# Patient Record
Sex: Female | Born: 2000 | Race: Black or African American | Hispanic: No | Marital: Single | Smoking: Never smoker
Health system: Southern US, Community
[De-identification: ages and names within clinical notes are randomized; demographics above are authoritative.]

## PROBLEM LIST (undated history)

## (undated) DIAGNOSIS — B54 Unspecified malaria: Secondary | ICD-10-CM

## (undated) DIAGNOSIS — N390 Urinary tract infection, site not specified: Secondary | ICD-10-CM

## (undated) DIAGNOSIS — A0100 Typhoid fever, unspecified: Secondary | ICD-10-CM

## (undated) DIAGNOSIS — J45909 Unspecified asthma, uncomplicated: Secondary | ICD-10-CM

## (undated) HISTORY — DX: Unspecified malaria: B54

## (undated) HISTORY — DX: Unspecified asthma, uncomplicated: J45.909

## (undated) HISTORY — DX: Typhoid fever, unspecified: A01.00

## (undated) HISTORY — PX: THROAT SURGERY: SHX803

## (undated) HISTORY — DX: Urinary tract infection, site not specified: N39.0

---
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
## 2020-02-02 DIAGNOSIS — Z23 Encounter for immunization: Secondary | ICD-10-CM | POA: Diagnosis not present

## 2020-04-05 ENCOUNTER — Ambulatory Visit (INDEPENDENT_AMBULATORY_CARE_PROVIDER_SITE_OTHER): Payer: Medicaid Other | Admitting: Family Medicine

## 2020-04-05 ENCOUNTER — Other Ambulatory Visit: Payer: Self-pay

## 2020-04-05 VITALS — BP 100/65 | HR 67 | Ht 63.0 in | Wt 127.6 lb

## 2020-04-05 DIAGNOSIS — Z30013 Encounter for initial prescription of injectable contraceptive: Secondary | ICD-10-CM | POA: Diagnosis not present

## 2020-04-05 LAB — POCT URINE PREGNANCY: Preg Test, Ur: NEGATIVE

## 2020-04-05 MED ORDER — MEDROXYPROGESTERONE ACETATE 150 MG/ML IM SUSY
150.0000 mg | PREFILLED_SYRINGE | Freq: Once | INTRAMUSCULAR | Status: AC
  Administered 2020-04-05: 150 mg via INTRAMUSCULAR

## 2020-04-05 NOTE — Progress Notes (Signed)
Patient presents for contraceptive.  Pregnancy test negative.  Depo-Provera given in Right Upper Outer Quadrant. Patient tolerated well.  Reminder card given 06-21-2020--07-05-2020  .Glennie Hawk, CMA

## 2020-04-05 NOTE — Assessment & Plan Note (Addendum)
Long discussion regarding contraception including different types and potential side effects. Patient has opted for the Depo Shot every 3 months. Urine pregnancy test negative Depo shot given today. Follow up within 3 months for repeat, sooner if any side effects. Did discuss potential break through bleeding and that this could last for 6-12 months. Patient voiced understanding and agreement with plan.

## 2020-04-05 NOTE — Progress Notes (Signed)
   Subjective:   Patient ID: Amy Petersen    DOB: 2000/07/14, 19 y.o. female   MRN: [id number]  Amy Petersen is a 19 y.o. female with a history of seasonal allergic rhinitis, mild acne, recurrent UTI, refugee here for personal issues.  Contraception Management: Patient interested in starting birth control. She has not been on birth control before. LMP 03/10/20. Notes periods are pretty regular. She is interested in birth control primarily for pregnancy protection. Last sexual intercourse was 1 month ago (after her period), she did use a condom. Denies history of blood clots, migraines with aura, or tobacco use.  Review of Systems:  Per HPI.   Objective:   BP 100/65   Pulse 67   Ht 5\' 3"  (1.6 m)   Wt 127 lb 9.6 oz (57.9 kg)   SpO2 100%   BMI 22.60 kg/m  Vitals and nursing note reviewed.  General: pleasant young female, sitting comfortably in exam chair, well nourished, well developed, in no acute distress with non-toxic appearance Resp: breathing comfortably on room air, speaking in full sentences MSK:  gait normal Neuro: Alert and oriented, speech normal  Assessment & Plan:   Encounter for initial prescription of injectable contraceptive Long discussion regarding contraception including different types and potential side effects. Patient has opted for the Depo Shot every 3 months. Urine pregnancy test negative Depo shot given today. Follow up within 3 months for repeat, sooner if any side effects. Did discuss potential break through bleeding and that this could last for 6-12 months. Patient voiced understanding and agreement with plan.  Orders Placed This Encounter  Procedures  . POCT urine pregnancy   Meds ordered this encounter  Medications  . medroxyPROGESTERone Acetate SUSY 150 mg    , DO PGY-3, [facility] 04/05/2020 12:15 PM

## 2020-05-03 DIAGNOSIS — Z20828 Contact with and (suspected) exposure to other viral communicable diseases: Secondary | ICD-10-CM | POA: Diagnosis not present

## 2020-07-18 DIAGNOSIS — R0600 Dyspnea, unspecified: Secondary | ICD-10-CM | POA: Diagnosis not present

## 2020-08-29 ENCOUNTER — Ambulatory Visit (HOSPITAL_COMMUNITY)
Admission: RE | Admit: 2020-08-29 | Discharge: 2020-08-29 | Disposition: A | Discharge status: Home / Self Care | Payer: Medicaid Other | Source: Ambulatory Visit | Attending: Family Medicine | Admitting: Family Medicine

## 2020-08-29 ENCOUNTER — Other Ambulatory Visit: Payer: Self-pay

## 2020-08-29 ENCOUNTER — Ambulatory Visit (INDEPENDENT_AMBULATORY_CARE_PROVIDER_SITE_OTHER): Payer: Medicaid Other | Admitting: Family Medicine

## 2020-08-29 ENCOUNTER — Ambulatory Visit
Admission: RE | Admit: 2020-08-29 | Discharge: 2020-08-29 | Disposition: A | Discharge status: Home / Self Care | Payer: Medicaid Other | Source: Ambulatory Visit | Attending: Family Medicine | Admitting: Family Medicine

## 2020-08-29 VITALS — BP 118/72 | HR 84 | Wt 138.0 lb

## 2020-08-29 DIAGNOSIS — L503 Dermatographic urticaria: Secondary | ICD-10-CM

## 2020-08-29 DIAGNOSIS — N926 Irregular menstruation, unspecified: Secondary | ICD-10-CM | POA: Diagnosis not present

## 2020-08-29 DIAGNOSIS — Z0289 Encounter for other administrative examinations: Secondary | ICD-10-CM

## 2020-08-29 DIAGNOSIS — J301 Allergic rhinitis due to pollen: Secondary | ICD-10-CM

## 2020-08-29 DIAGNOSIS — R0602 Shortness of breath: Secondary | ICD-10-CM | POA: Diagnosis not present

## 2020-08-29 DIAGNOSIS — R059 Cough, unspecified: Secondary | ICD-10-CM | POA: Diagnosis not present

## 2020-08-29 LAB — POCT URINE PREGNANCY: Preg Test, Ur: NEGATIVE

## 2020-08-29 MED ORDER — LEVOCETIRIZINE DIHYDROCHLORIDE 5 MG PO TABS: 5.0000 mg | ORAL_TABLET | Freq: Every evening | ORAL | 2 refills | Status: DC

## 2020-08-29 MED ORDER — HYDROXYZINE PAMOATE 25 MG PO CAPS: 25.0000 mg | ORAL_CAPSULE | Freq: Three times a day (TID) | ORAL | 1 refills | Status: DC | PRN

## 2020-08-29 NOTE — Patient Instructions (Addendum)
It was great seeing you today!  Please check-out at the front desk before leaving the clinic. I'd like to see you back in 2 weeks but if you need to be seen earlier than that for any new issues we're happy to fit you in, just give [place] a call!  Visit Remembers: - Stop by the pharmacy to pick up your prescriptions  - Medicine Changes: Start Atarax (for itching and anxiety). Start Xyzal for allergies.  - To Do: ** Stop by [facility] to get your chest xray today or tomorrow  ** Keep a diary of your symptoms. Document: what day symptoms occurred, how long it lasted, what you did to make it go away, if you were outside that day   Regarding lab work today:  Due to recent changes in healthcare laws, you may see the results of your imaging and laboratory studies on MyChart before your provider has had a chance to review them.  I understand that in some cases there may be results that are confusing or concerning to you. Not all laboratory results come back in the same time frame and you may be waiting for multiple results in order to interpret others.  Please give [place] 72 hours in order for your provider to thoroughly review all the results before contacting the office for clarification of your results. If everything is normal, you will get a letter in the mail or a message in My Chart. Please give [place] a call if you do not hear from [place] after 2 weeks.  Please bring all of your medications with you to each visit.    If you haven't already, sign up for My Chart to have easy access to your labs results, and communication with your primary care physician.  Feel free to call with any questions or concerns at any time, at [phone].   Take care,  Dr. Katherina [facility]

## 2020-08-29 NOTE — Progress Notes (Signed)
   SUBJECTIVE:   CHIEF COMPLAINT / HPI:   Chief Complaint  Patient presents with  . Shortness of Breath     Amy Petersen is a 20 y.o. female here for shortness of breath.   Pt reports shortness of breath occurs while at rest. She plays soccer and reports no shortness of breath while being active. Sx started a couple months ago.  She feels like she " is running out of breath."  Last night her family looked at her while she was breathing funny and states that she was wheezing.  She occasionally has a dry cough and chest discomfort during these episodes.  States she has had similar symptoms in the past and thought to be related to allergies when she first moved to the [place].  She is currently not taking any allergy medication.  Earlier this week she went [facility] who found no cause of her symptoms.  Palpitations, daily cough, congestion, rhinorrhea, fevers, myalgias, recent trauma to chest, unintended weight loss or night sweats.  Episodes typically last about 20 minutes but 2 days ago and lasted for 1 hour.  States that her heart is not beating fast nor she trembling during these episodes.  It does go away when she stops thinking about it.  Because the episode lasted an hour she decided to make an appointment sure everything was okay.   PERTINENT  PMH / PSH: reviewed and updated as appropriate   OBJECTIVE:   BP 118/72   Pulse 84   Wt 138 lb (62.6 kg)   LMP 07/30/2020 Comment: just stopped taking depo  BMI 24.45 kg/m    GEN: pleasant well-appearing female, in no acute distress  NECK: Supple, no lymphadenopathy, normal range of motion CV: regular rate and rhythm, no murmurs appreciated, well perfused, no chest wall tenderness RESP: no increased work of breathing, clear to ascultation bilaterally with no crackles, wheezes, or rhonchi  ABD: Bowel sounds present.  Soft, nontender, nondistended  MSK: no extremity edema, or calf tenderness SKIN: warm, dry, upper back with  erythematous raised lines after scratching     ASSESSMENT/PLAN:   Dermatography Suspected dermatography on exam after patient lightly scratched her upper back. Trial Xyzal.   Seasonal allergic rhinitis due to pollen Refilled Flonase. Start Xyzal 5 mg daily.   Shortness of breath Pt is a 20 yo female with intermittent dyspnea at rest. Per chart review, there is questionable hx of asthma. Will give inhaler. Start Xyzal. Patient is a refugee will obtain a quant gold. EKG unremarkable, HR 68, normal axis, no acute ST or T wave changes. CXR ordered. Possibly anxiety related. Trial Atarax during events. Advised patient to keep event log. Follow up in 2 weeks.      Katha Cabal, DO PGY-2, [facility] 08/30/2020

## 2020-08-30 DIAGNOSIS — L503 Dermatographic urticaria: Secondary | ICD-10-CM | POA: Insufficient documentation

## 2020-08-30 DIAGNOSIS — R0602 Shortness of breath: Secondary | ICD-10-CM | POA: Insufficient documentation

## 2020-08-30 MED ORDER — ALBUTEROL SULFATE HFA 108 (90 BASE) MCG/ACT IN AERS: 2.0000 | INHALATION_SPRAY | Freq: Four times a day (QID) | RESPIRATORY_TRACT | 2 refills | Status: DC | PRN

## 2020-08-30 MED ORDER — FLUTICASONE PROPIONATE 50 MCG/ACT NA SUSP: 2.0000 | Freq: Every day | NASAL | 6 refills | Status: DC

## 2020-08-30 NOTE — Assessment & Plan Note (Signed)
Refilled Flonase. Start Xyzal 5 mg daily.

## 2020-08-30 NOTE — Assessment & Plan Note (Addendum)
Suspected dermatography on exam after patient lightly scratched her upper back. Trial Xyzal.

## 2020-08-30 NOTE — Assessment & Plan Note (Addendum)
Pt is a 20 yo female with intermittent dyspnea at rest. Per chart review, there is questionable hx of asthma. Will give inhaler. Start Xyzal. Patient is a refugee will obtain a quant gold. EKG unremarkable, HR 68, normal axis, no acute ST or T wave changes. CXR ordered. Possibly anxiety related. Trial Atarax during events. Advised patient to keep event log. Follow up in 2 weeks.

## 2020-09-02 LAB — QUANTIFERON-TB GOLD PLUS
QuantiFERON Mitogen Value: >10 IU/mL
QuantiFERON Nil Value: 0.03 IU/mL
QuantiFERON TB1 Ag Value: 0.04 IU/mL
QuantiFERON TB2 Ag Value: 0.03 IU/mL
QuantiFERON-TB Gold Plus: NEGATIVE

## 2020-09-02 LAB — TSH+FREE T4
Free T4: 1.17 ng/dL (ref 0.93–1.60)
TSH: 0.797 u[IU]/mL (ref 0.450–4.500)

## 2020-09-02 LAB — CBC
Hematocrit: 40 % (ref 34.0–46.6)
Hemoglobin: 13.5 g/dL (ref 11.1–15.9)
MCH: 28.2 pg (ref 26.6–33.0)
MCHC: 33.8 g/dL (ref 31.5–35.7)
MCV: 84 fL (ref 79–97)
Platelets: 371 10*3/uL (ref 150–450)
RBC: 4.78 x10E6/uL (ref 3.77–5.28)
RDW: 12.4 % (ref 11.7–15.4)
WBC: 6.7 10*3/uL (ref 3.4–10.8)

## 2020-09-10 ENCOUNTER — Ambulatory Visit (INDEPENDENT_AMBULATORY_CARE_PROVIDER_SITE_OTHER): Payer: Medicaid Other | Admitting: Family Medicine

## 2020-09-10 DIAGNOSIS — Z5329 Procedure and treatment not carried out because of patient's decision for other reasons: Secondary | ICD-10-CM

## 2020-09-11 DIAGNOSIS — Z5329 Procedure and treatment not carried out because of patient's decision for other reasons: Secondary | ICD-10-CM | POA: Insufficient documentation

## 2020-09-11 DIAGNOSIS — Z91199 Patient's noncompliance with other medical treatment and regimen due to unspecified reason: Secondary | ICD-10-CM | POA: Insufficient documentation

## 2020-09-11 NOTE — Progress Notes (Signed)
Patient was a no-show to her 09/11/2020 appointment.  She can reschedule as needed.  Peggyann Shoals, DO [facility], PGY-3 09/11/2020 9:31 AM

## 2021-01-25 ENCOUNTER — Ambulatory Visit: Payer: Medicaid Other | Admitting: Family Medicine

## 2021-02-22 ENCOUNTER — Other Ambulatory Visit: Payer: Self-pay

## 2021-02-22 ENCOUNTER — Ambulatory Visit (INDEPENDENT_AMBULATORY_CARE_PROVIDER_SITE_OTHER): Payer: Medicaid Other | Admitting: Family Medicine

## 2021-02-22 ENCOUNTER — Encounter: Payer: Self-pay | Admitting: Family Medicine

## 2021-02-22 VITALS — BP 117/76 | HR 68 | Wt 135.4 lb

## 2021-02-22 DIAGNOSIS — R0602 Shortness of breath: Secondary | ICD-10-CM

## 2021-02-22 DIAGNOSIS — Z309 Encounter for contraceptive management, unspecified: Secondary | ICD-10-CM | POA: Diagnosis not present

## 2021-02-22 DIAGNOSIS — Z3009 Encounter for other general counseling and advice on contraception: Secondary | ICD-10-CM | POA: Diagnosis not present

## 2021-02-22 LAB — POCT URINE PREGNANCY: Preg Test, Ur: NEGATIVE

## 2021-02-22 MED ORDER — NORETHIN ACE-ETH ESTRAD-FE 1.5-30 MG-MCG PO TABS: 1.0000 | ORAL_TABLET | Freq: Every day | ORAL | 11 refills | Status: DC

## 2021-02-22 MED ORDER — XULANE 150-35 MCG/24HR TD PTWK: 1.0000 | MEDICATED_PATCH | TRANSDERMAL | 12 refills | Status: DC

## 2021-02-22 MED ORDER — ALBUTEROL SULFATE HFA 108 (90 BASE) MCG/ACT IN AERS
2.0000 | INHALATION_SPRAY | Freq: Four times a day (QID) | RESPIRATORY_TRACT | 2 refills | Status: DC | PRN
Start: 2021-02-22 — End: 2023-07-10

## 2021-02-22 NOTE — Progress Notes (Signed)
    SUBJECTIVE:   CHIEF COMPLAINT / HPI:   Birth Control Counseling  Amy Petersen is a 20 y.o. female who presents to the clinic today to discuss birth control options. She was previously seen in the office for this in 03/2020 and decided on Depo at that time. She states that she did not like how this affected her periods and does not wish to continue on it. She did not get any additional Depo injections after her first. Her LMP was 10/5. She has one sexual partner. She denies history of migraine with aura, blood clotting disorders, tobacco use.   PERTINENT  PMH / PSH:  Past Medical History:  Diagnosis Date   Asthma    Malaria    Recurrent UTI    Typhoid      OBJECTIVE:   BP 117/76   Pulse 68   Wt 135 lb 6.4 oz (61.4 kg)   SpO2 100%   BMI 23.99 kg/m    General: NAD, pleasant, able to participate in exam Respiratory: Breathing comfortably on room air, no respiratory distress Neuro: alert, no obvious focal deficits Psych: Normal affect and mood  ASSESSMENT/PLAN:   Birth control counseling Pregnancy test negative today. Discussed the various options of birth control including LARC's such as IUD/Nexplanon, birth control pills, birth control patches, vaginal rings, Depo shot, condoms.  Patient is very hesitant about both Nexplanon and IUD at this time- given she has heard some bad stories and is worried about pain associated with the procedure. We discussed that these are the most effective forms and that most people do really well with this. She would like more time to think about this. In the meantime, she was interested in OCP. She has no contraindications. She was advised to use back up contraception given the quick start method.  Discussed how this would not protect her against sexually transmitted infections and to use barrier protection to prevent this. She was given a handout to read more about the different options. She should contact our office should she decide to pursue  an IUD or Nexplanon.  Shortness of breath Patient notes she has a questionable history of asthma.  She uses inhaler as needed and notes that it helps when she has these episodes of difficulty breathing.  She was seen for this in the past and advised to trial both hydroxyzine and Xyzal as there was suspicion this could be related to anxiety or allergies.  She denies taking those medications.  States she needs a refill on her albuterol.  Will refill albuterol to be used PRN for SOB/wheezing.     Sabino Dick, DO [facility]

## 2021-02-22 NOTE — Assessment & Plan Note (Signed)
Patient notes she has a questionable history of asthma.  She uses inhaler as needed and notes that it helps when she has these episodes of difficulty breathing.  She was seen for this in the past and advised to trial both hydroxyzine and Xyzal as there was suspicion this could be related to anxiety or allergies.  She denies taking those medications.  States she needs a refill on her albuterol.  Will refill albuterol to be used PRN for SOB/wheezing.

## 2021-02-22 NOTE — Patient Instructions (Addendum)
It was wonderful to see you today.  Today we talked about:  -I have sent in birth control pills. It is important to take this at the same time every day.  -Know that this does not prevent against sexually transmitted infections.  -Use back up protection for 7-days. -If you decide that you want a Nexplanon (arm implant) or the IUD (intra-uterine implant), please schedule an appointment for placement.    Thank you for choosing [facility].   Please call [phone] with any questions about today's appointment.  Please be sure to schedule follow up at the front  desk before you leave today.   Sabino Dick, DO PGY-2 Family Medicine   Contraception Choices Contraception, also called birth control, refers to methods or devices that prevent pregnancy. Hormonal methods Contraceptive implant A contraceptive implant is a thin, plastic tube that contains a hormone that prevents pregnancy. It is different from an intrauterine device (IUD). It is inserted into the upper part of the arm by a health care provider. Implants can be effective for up to 3 years. Progestin-only injections Progestin-only injections are injections of progestin, a synthetic form of the hormone progesterone. They are given every 3 months by a health care provider. Birth control pills Birth control pills are pills that contain hormones that prevent pregnancy. They must be taken once a day, preferably at the same time each day. A prescription is needed to use this method of contraception. Birth control patch The birth control patch contains hormones that prevent pregnancy. It is placed on the skin and must be changed once a week for three weeks and removed on the fourth week. A prescription is needed to use this method of contraception. Vaginal ring A vaginal ring contains hormones that prevent pregnancy. It is placed in the vagina for three weeks and removed on the fourth week. After that, the process is  repeated with a new ring. A prescription is needed to use this method of contraception. Emergency contraceptive Emergency contraceptives prevent pregnancy after unprotected sex. They come in pill form and can be taken up to 5 days after sex. They work best the sooner they are taken after having sex. Most emergency contraceptives are available without a prescription. This method should not be used as your only form of birth control. Barrier methods Female condom A female condom is a thin sheath that is worn over the penis during sex. Condoms keep sperm from going inside a woman's body. They can be used with a sperm-killing substance (spermicide) to increase their effectiveness. They should be thrown away after one use. Female condom A female condom is a soft, loose-fitting sheath that is put into the vagina before sex. The condom keeps sperm from going inside a woman's body. They should be thrown away after one use. Diaphragm A diaphragm is a soft, dome-shaped barrier. It is inserted into the vagina before sex, along with a spermicide. The diaphragm blocks sperm from entering the uterus, and the spermicide kills sperm. A diaphragm should be left in the vagina for 6-8 hours after sex and removed within 24 hours. A diaphragm is prescribed and fitted by a health care provider. A diaphragm should be replaced every 1-2 years, after giving birth, after gaining more than 15 lb (6.8 kg), and after pelvic surgery. Cervical cap A cervical cap is a round, soft latex or plastic cup that fits over the cervix. It is inserted into the vagina before sex, along with spermicide. It blocks sperm from entering  the uterus. The cap should be left in place for 6-8 hours after sex and removed within 48 hours. A cervical cap must be prescribed and fitted by a health care provider. It should be replaced every 2 years. Sponge A sponge is a soft, circular piece of polyurethane foam with spermicide in it. The sponge helps block sperm  from entering the uterus, and the spermicide kills sperm. To use it, you make it wet and then insert it into the vagina. It should be inserted before sex, left in for at least 6 hours after sex, and removed and thrown away within 30 hours. Spermicides Spermicides are chemicals that kill or block sperm from entering the cervix and uterus. They can come as a cream, jelly, suppository, foam, or tablet. A spermicide should be inserted into the vagina with an applicator at least 10-15 minutes before sex to allow time for it to work. The process must be repeated every time you have sex. Spermicides do not require a prescription. Intrauterine contraception Intrauterine device (IUD) An IUD is a T-shaped device that is put in a woman's uterus. There are two types: Hormone IUD.This type contains progestin, a synthetic form of the hormone progesterone. This type can stay in place for 3-5 years. Copper IUD.This type is wrapped in copper wire. It can stay in place for 10 years. Permanent methods of contraception Female tubal ligation In this method, a woman's fallopian tubes are sealed, tied, or blocked during surgery to prevent eggs from traveling to the uterus. Hysteroscopic sterilization In this method, a small, flexible insert is placed into each fallopian tube. The inserts cause scar tissue to form in the fallopian tubes and block them, so sperm cannot reach an egg. The procedure takes about 3 months to be effective. Another form of birth control must be used during those 3 months. Female sterilization This is a procedure to tie off the tubes that carry sperm (vasectomy). After the procedure, the man can still ejaculate fluid (semen). Another form of birth control must be used for 3 months after the procedure. Natural planning methods Natural family planning In this method, a couple does not have sex on days when the woman could become pregnant. Calendar method In this method, the woman keeps track of the  length of each menstrual cycle, identifies the days when pregnancy can happen, and does not have sex on those days. Ovulation method In this method, a couple avoids sex during ovulation. Symptothermal method This method involves not having sex during ovulation. The woman typically checks for ovulation by watching changes in her temperature and in the consistency of cervical mucus. Post-ovulation method In this method, a couple waits to have sex until after ovulation. Where to find more information Centers for Disease Control and Prevention: [employer] Summary Contraception, also called birth control, refers to methods or devices that prevent pregnancy. Hormonal methods of contraception include implants, injections, pills, patches, vaginal rings, and emergency contraceptives. Barrier methods of contraception can include female condoms, female condoms, diaphragms, cervical caps, sponges, and spermicides. There are two types of IUDs (intrauterine devices). An IUD can be put in a woman's uterus to prevent pregnancy for 3-5 years. Permanent sterilization can be done through a procedure for males and females. Natural family planning methods involve nothaving sex on days when the woman could become pregnant. This information is not intended to replace advice given to you by your health care provider. Make sure you discuss any questions you have with your health care provider. Document  Revised: 09/12/2019 Document Reviewed: 09/12/2019 Elsevier Patient Education  2022 [employer].

## 2021-02-22 NOTE — Assessment & Plan Note (Signed)
Pregnancy test negative today. Discussed the various options of birth control including LARC's such as IUD/Nexplanon, birth control pills, birth control patches, vaginal rings, Depo shot, condoms.  Patient is very hesitant about both Nexplanon and IUD at this time- given she has heard some bad stories and is worried about pain associated with the procedure. We discussed that these are the most effective forms and that most people do really well with this. She would like more time to think about this. In the meantime, she was interested in OCP. She has no contraindications. She was advised to use back up contraception given the quick start method.  Discussed how this would not protect her against sexually transmitted infections and to use barrier protection to prevent this. She was given a handout to read more about the different options. She should contact our office should she decide to pursue an IUD or Nexplanon.

## 2021-04-17 DIAGNOSIS — S134XXA Sprain of ligaments of cervical spine, initial encounter: Secondary | ICD-10-CM | POA: Diagnosis not present

## 2021-04-17 DIAGNOSIS — S20219A Contusion of unspecified front wall of thorax, initial encounter: Secondary | ICD-10-CM | POA: Diagnosis not present

## 2021-05-15 ENCOUNTER — Encounter: Payer: Self-pay | Admitting: Family Medicine

## 2021-05-15 ENCOUNTER — Ambulatory Visit (INDEPENDENT_AMBULATORY_CARE_PROVIDER_SITE_OTHER): Payer: Medicaid Other | Admitting: Family Medicine

## 2021-05-15 ENCOUNTER — Other Ambulatory Visit: Payer: Self-pay

## 2021-05-15 ENCOUNTER — Ambulatory Visit
Admission: RE | Admit: 2021-05-15 | Discharge: 2021-05-15 | Disposition: A | Discharge status: Home / Self Care | Payer: Medicaid Other | Source: Ambulatory Visit | Attending: Family Medicine | Admitting: Family Medicine

## 2021-05-15 VITALS — BP 112/71 | HR 104 | Wt 139.2 lb

## 2021-05-15 DIAGNOSIS — R0602 Shortness of breath: Secondary | ICD-10-CM

## 2021-05-15 DIAGNOSIS — Z3009 Encounter for other general counseling and advice on contraception: Secondary | ICD-10-CM | POA: Diagnosis not present

## 2021-05-15 DIAGNOSIS — R0600 Dyspnea, unspecified: Secondary | ICD-10-CM | POA: Diagnosis not present

## 2021-05-15 LAB — D-DIMER, QUANTITATIVE: D-DIMER: 0.36 mg/L FEU (ref 0.00–0.49)

## 2021-05-15 NOTE — Assessment & Plan Note (Signed)
Given tachycardia on presentation, consideration of PE, thought I think this is lower probability given duration, symptoms. CBC, D-dimer sent stat. CXR obtained. Scheduled for PFT. Pending results, will start inhaler. Heart rate had normalized on exam and no hypoxemia today.

## 2021-05-15 NOTE — Assessment & Plan Note (Signed)
Discussed she will consider Nexplanon. Other options discussed today as well-declined Depo. Discussed access to Plan B, Condoms, recommended prenatal.

## 2021-05-15 NOTE — Progress Notes (Signed)
° ° °  SUBJECTIVE:   CHIEF COMPLAINT: dyspnea  HPI:   Amy Petersen is a 21 y.o.  with history notable for recurrent UTI and allergies presenting for follow up for dyspnea.  The patient has been twice for dyspnea over past year. Reports flares of this over past 2 weeks. Reports episodes throughout day, regardless of activity of dyspnea, higher heart rate, and chest tightness. No chest pain, no LE edema, no diaphoresis, no nausea, or vomiting. Had improvement with inhaler. Was on COC but stopped these due to headaches. She is interested in Nexplanon (possibly).   She is sexually active, 1 partner, uses condoms. She is sophomore at [employer].    PERTINENT  PMH / PSH/Family/Social History : No family history of VTE or asthma, she has a history of allergies   Prior EKG, CXR one year ago reviewed  OBJECTIVE:   BP 112/71    Pulse (!) 104    Wt 139 lb 3.2 oz (63.1 kg)    LMP 04/04/2021 (Approximate)    SpO2 100%    BMI 24.66 kg/m   Today's weight:  Last Weight  Most recent update: 05/15/2021  8:52 AM    Weight  63.1 kg (139 lb 3.2 oz)            Review of prior weights: Filed Weights   05/15/21 0851  Weight: 139 lb 3.2 oz (63.1 kg)     Cardiac: Regular rate and rhythm. Normal S1/S2. No murmurs, rubs, or gallops appreciated. Lungs: Clear bilaterally to ascultation.  Abdomen: Normoactive bowel sounds. No tenderness to deep or light palpation. No rebound or guarding.   Psych: Pleasant and appropriate  No LE edema    ASSESSMENT/PLAN:   Shortness of breath Given tachycardia on presentation, consideration of PE, thought I think this is lower probability given duration, symptoms. CBC, D-dimer sent stat. CXR obtained. Scheduled for PFT. Pending results, will start inhaler. Heart rate had normalized on exam and no hypoxemia today.   Birth control counseling Discussed she will consider Nexplanon. Other options discussed today as well-declined Depo. Discussed access to Plan B,  Condoms, recommended prenatal.    HCM  HPV today     Terisa Starr, MD  Family Medicine Teaching Service  [facility]

## 2021-05-15 NOTE — Patient Instructions (Addendum)
It was wonderful to see you today.  Please bring ALL of your medications with you to every visit.   Today we talked about: An x-ray was ordered for you---you do not need an appointment to have this completed.  I recommend going to [facility] [address] [place] [address] [place]   If the results are normal,I will send you a letter  I will call you with results if anything is abnormal   I will call you with blood work   Thank you for choosing [facility].   Please call [phone] with any questions about today's appointment.  Please be sure to schedule follow up at the front  desk before you leave today.   Terisa Starr, MD  Family Medicine

## 2021-05-16 LAB — CBC
Hematocrit: 39.3 % (ref 34.0–46.6)
Hemoglobin: 13.3 g/dL (ref 11.1–15.9)
MCH: 27.8 pg (ref 26.6–33.0)
MCHC: 33.8 g/dL (ref 31.5–35.7)
MCV: 82 fL (ref 79–97)
Platelets: 349 10*3/uL (ref 150–450)
RBC: 4.78 x10E6/uL (ref 3.77–5.28)
RDW: 12.5 % (ref 11.7–15.4)
WBC: 9.8 10*3/uL (ref 3.4–10.8)

## 2021-05-23 ENCOUNTER — Other Ambulatory Visit: Payer: Self-pay

## 2021-05-23 ENCOUNTER — Encounter: Payer: Self-pay | Admitting: Pharmacist

## 2021-05-23 ENCOUNTER — Ambulatory Visit (INDEPENDENT_AMBULATORY_CARE_PROVIDER_SITE_OTHER): Payer: Medicaid Other | Admitting: Pharmacist

## 2021-05-23 ENCOUNTER — Encounter: Payer: Self-pay | Admitting: Family Medicine

## 2021-05-23 ENCOUNTER — Ambulatory Visit (INDEPENDENT_AMBULATORY_CARE_PROVIDER_SITE_OTHER): Payer: Medicaid Other | Admitting: Family Medicine

## 2021-05-23 VITALS — BP 109/63 | HR 77 | Ht 63.0 in | Wt 142.0 lb

## 2021-05-23 DIAGNOSIS — R0602 Shortness of breath: Secondary | ICD-10-CM

## 2021-05-23 DIAGNOSIS — Z30017 Encounter for initial prescription of implantable subdermal contraceptive: Secondary | ICD-10-CM | POA: Diagnosis not present

## 2021-05-23 DIAGNOSIS — Z23 Encounter for immunization: Secondary | ICD-10-CM | POA: Diagnosis not present

## 2021-05-23 DIAGNOSIS — Z3046 Encounter for surveillance of implantable subdermal contraceptive: Secondary | ICD-10-CM | POA: Diagnosis not present

## 2021-05-23 LAB — POCT URINE PREGNANCY: Preg Test, Ur: NEGATIVE

## 2021-05-23 NOTE — Assessment & Plan Note (Signed)
Patient has been experiencing shortness of breath and chest tightness for two years and taking Ventolin. Medication adherence reported as good.  Spirometry evaluation with pre- and post-bronchodilator reveals normal lung function.   -continue Ventolin inhaler q6h PRN   -Reviewed results of pulmonary function tests.  -Patient provided with and educated on use of peak flow meter.  Patient verbalized understanding of results and education.  Written pt instructions provided.

## 2021-05-23 NOTE — Progress Notes (Signed)
Noted and agree. 

## 2021-05-23 NOTE — Progress Notes (Signed)
° °  S:    Patient arrives in good spirits and ambulating independently. Presents for lung function evaluation.   Patient was referred and last seen by Primary Care Provider, Dr. Owens Shark, on 05/15/2021.   Patient reports breathing has been suboptimal. Reports using ventolin inhaler at least once a day if symptoms arise. The cold weather is often a trigger. When the coughing fits come on, it often arises suddenly. Patient describes it as chest tightness and shortness of breath.  Patient reports adherence to medications Patient reports last dose of albuterol was yesterday, Current asthma medications: Ventolin HFA 2 puffs q6h PRN Rescue inhaler use frequency: at least once a day; not caused by physical exertion. Reports inhaler helps a lot with symptom management.  Level of asthma sx control- in the last 4 weeks: Question Scoring Patient Score  Daytime sx more than twice a week Yes (1) No (0) 1  Any nighttime waking due to asthma Yes (1) No (0) 1  Reliever needed >2x/week Yes (1) No (0) 1  Any activity limitation due to asthma Yes (1) No (0) 0   Total Score 3  Well controlled - 0, partly controlled - 1-2, uncontrolled 3-4  O: Physical Exam Constitutional:      Appearance: Normal appearance. She is normal weight.  Pulmonary:     Effort: Pulmonary effort is normal.  Neurological:     Mental Status: She is alert.  Psychiatric:        Mood and Affect: Mood normal.        Behavior: Behavior normal.        Thought Content: Thought content normal.        Judgment: Judgment normal.    Review of Systems  Respiratory:  Positive for shortness of breath and wheezing (frequent with dyspnea). Negative for cough (none recent) and sputum production.   All other systems reviewed and are negative.  Vitals:   05/23/21 1507  BP: 118/78  Pulse: 84    See "scanned report" or Documentation Flowsheet (discrete results - PFTs) for  Spirometry results. Patient provided good effort while attempting  spirometry.   Lung Age = 65 Albuterol Neb  Lot# [id number]     Exp. 07/19/2022  A/P: Patient has been experiencing shortness of breath and chest tightness for two years and taking Ventolin. Medication adherence reported as good.  Spirometry evaluation with pre- and post-bronchodilator reveals normal lung function.   -continue Ventolin inhaler q6h PRN    -Reviewed results of pulmonary function tests.  -Patient provided with and educated on use of peak flow meter.  Patient verbalized understanding of results and education.  Written pt instructions provided.    F/U visit with Dr. Owens Shark in next 3-4wks.   Total time in face to face counseling 45 minutes.  Patient seen with Gala Murdoch, PharmD Candidate, and Rebbeca Paul, PharmD - PGY2 Pharmacy Resident.  Marland Kitchen

## 2021-05-23 NOTE — Patient Instructions (Signed)
Nexplanon Instructions After Insertion  Keep bandage clean and dry for 24 hours  May use ice/Tylenol/Ibuprofen for soreness or pain  If you develop fever, drainage or increased warmth from incision site-contact office immediately   

## 2021-05-23 NOTE — Patient Instructions (Addendum)
It was nice to see you today.  The PFT does not show reversible component. We will continue Ventolin HFA 2 puffs every 6h as needed.  Please follow up with Dr. Manson Passey in the next 3-4 weeks.

## 2021-05-23 NOTE — Progress Notes (Signed)
° ° °  SUBJECTIVE:   CHIEF COMPLAINT / HPI:   [name] is a 21 yo F who presents for the below.   Desires nexplanon placement Had a previous visit for birth control counseling and decided Was best for her.  States that the pills gave her bad migraines. Partner present today. No other concerns at this time.   PERTINENT  PMH / PSH: As above.  OBJECTIVE:   BP 109/63    Pulse 77    Ht 5\' 3"  (1.6 m)    Wt 142 lb (64.4 kg)    LMP 05/01/2021    SpO2 100%    BMI 25.15 kg/m   Results for orders placed or performed in visit on 05/23/21 (from the past 24 hour(s))  POCT urine pregnancy     Status: None   Collection Time: 05/23/21  9:50 AM  Result Value Ref Range   Preg Test, Ur Negative Negative   Physical Exam Vitals reviewed.  Pulmonary:     Breath sounds: Normal breath sounds.  Neurological:     Mental Status: She is alert and oriented to person, place, and time.  Psychiatric:        Mood and Affect: Mood normal.        Behavior: Behavior normal.   ASSESSMENT/PLAN:   Encounter for initial prescription of Nexplanon PROCEDURE NOTE: Nexplanon insertion Patient given informed consent, signed copy in the chart.  Appropriate time out taken  Pregnancy test was negative.  The patient's  left arm was prepped and draped in the usual sterile fashion.. The ruler used to measure and mark the insertion area 8 cm from medial epicondyle of the elbow. Local anaesthesia obtained using 1.5 cc of 1% lidocaine with epinephrine. Nexplanon was inserted per manufacturer's directions.  Nexplanon did not fully deploy and was visible at site of injection.  This Nexplanon was removed.  A new Nexplanon was used and step above repeated without issue with normal deployment. Less than 1 cc blood loss. The insertion site covered with steri-strips and a pressure bandage to minimize bruising. The patient tolerated the procedure well.  Post-procedural care instructions given. Patient is informed the removal date will be  in three years and package insert card filled out and given to her.  07/21/21, DO [facility]

## 2021-06-18 MED ORDER — ETONOGESTREL 68 MG ~~LOC~~ IMPL: 68.0000 mg | DRUG_IMPLANT | Freq: Once | SUBCUTANEOUS | Status: DC

## 2021-06-18 MED ORDER — ETONOGESTREL 68 MG ~~LOC~~ IMPL
68.0000 mg | DRUG_IMPLANT | Freq: Once | SUBCUTANEOUS | Status: AC
  Administered 2021-05-23: 68 mg via SUBCUTANEOUS

## 2021-06-18 NOTE — Addendum Note (Signed)
Addended by: Veronda Prude on: 06/18/2021 10:26 AM   Modules accepted: Orders

## 2021-09-11 NOTE — Progress Notes (Unsigned)
Synopsis: Referred for dyspnea by Westley Chandler, MD  Subjective:   PATIENT ID: Amy Petersen GENDER: female DOB: 2000-09-26, MRN: [id number]  No chief complaint on file.  20yF with history of asthma, malaria, typhoid, recurrent UTI self-referred for dyspnea  Otherwise pertinent review of systems is negative.  Past Medical History:  Diagnosis Date   Asthma    Malaria    Recurrent UTI    Typhoid      No family history on file.   Past Surgical History:  Procedure Laterality Date   THROAT SURGERY      Social History   Socioeconomic History   Marital status: Single    Spouse name: Not on file   Number of children: Not on file   Years of education: Not on file   Highest education level: Not on file  Occupational History   Not on file  Tobacco Use   Smoking status: Never   Smokeless tobacco: Never  Substance and Sexual Activity   Alcohol use: Never   Drug use: Never   Sexual activity: Never  Other Topics Concern   Not on file  Social History Narrative   Refugee Information   Number of Immediate Family Members: 2   Number of Immediate Family Members in [place]: 2(Katherine Mukaraubayiza)   Date of Arrival: 01/04/19   Country of Birth: [facility])   Country of Origin: [place]   Location of Refugee Camp: [place]   Reason for Leaving Home Country: Political opinion   Primary Language: Kinyarwanda/[place], [place], English   Able to Read in Primary Language: Yes   Able to Write in Primary Language: Yes   Education: High School(10th grade in [place])         Moved from [place] in September 2020    Celine Ahr is legal guardian    Social Determinants of Corporate investment banker Strain: Not on file  Food Insecurity: Not on file  Transportation Needs: Not on file  Physical Activity: Not on file  Stress: Not on file  Social Connections: Not on file  Intimate Partner Violence: Not on file     No Known Allergies   Outpatient Medications Prior to Visit  Medication  Sig Dispense Refill   albuterol (VENTOLIN HFA) 108 (90 Base) MCG/ACT inhaler Inhale 2 puffs into the lungs every 6 (six) hours as needed for wheezing or shortness of breath. 8 g 2   Facility-Administered Medications Prior to Visit  Medication Dose Route Frequency Provider Last Rate Last Admin   etonogestrel (NEXPLANON) implant 68 mg  68 mg Subdermal Once Autry-Lott, Simone, DO           Objective:   Physical Exam:  General appearance: 21 y.o., female, NAD, conversant  Eyes: anicteric sclerae; PERRL, tracking appropriately HENT: NCAT; MMM Neck: Trachea midline; no lymphadenopathy, no JVD Lungs: CTAB, no crackles, no wheeze, with normal respiratory effort CV: RRR, no murmur  Abdomen: Soft, non-tender; non-distended, BS present  Extremities: No peripheral edema, warm Skin: Normal turgor and texture; no rash Psych: Appropriate affect Neuro: Alert and oriented to person and place, no focal deficit     There were no vitals filed for this visit.   on *** LPM *** RA BMI Readings from Last 3 Encounters:  05/23/21 25.33 kg/m  05/23/21 25.15 kg/m  05/15/21 24.66 kg/m   Wt Readings from Last 3 Encounters:  05/23/21 143 lb (64.9 kg)  05/23/21 142 lb (64.4 kg)  05/15/21 139 lb 3.2 oz (63.1 kg)  CBC    Component Value Date/Time   WBC 9.8 05/15/2021 0930   RBC 4.78 05/15/2021 0930   HGB 13.3 05/15/2021 0930   HCT 39.3 05/15/2021 0930   PLT 349 05/15/2021 0930   MCV 82 05/15/2021 0930   MCH 27.8 05/15/2021 0930   MCHC 33.8 05/15/2021 0930   RDW 12.5 05/15/2021 0930   LYMPHSABS 2.7 02/22/2019 1641   EOSABS 0.2 02/22/2019 1641   BASOSABS 0.0 02/22/2019 1641    D-dimer 05/15/21 normal   Chest Imaging: CXR 05/15/21 unremarkable  Pulmonary Functions Testing Results:     View : No data to display.         Cleda Daub 05/23/21 with moderate obstruction, reduced FVC, no significant BD response        Assessment & Plan:    Plan:      Omar Person,  MD [facility] 09/11/2021 8:45 AM

## 2021-09-12 ENCOUNTER — Other Ambulatory Visit: Payer: Self-pay | Admitting: Student

## 2021-09-12 ENCOUNTER — Ambulatory Visit (INDEPENDENT_AMBULATORY_CARE_PROVIDER_SITE_OTHER): Payer: Medicaid Other | Admitting: Student

## 2021-09-12 ENCOUNTER — Encounter: Payer: Self-pay | Admitting: Student

## 2021-09-12 VITALS — BP 104/70 | HR 68 | Temp 98.8°F | Ht 63.0 in | Wt 139.0 lb

## 2021-09-12 DIAGNOSIS — J45998 Other asthma: Secondary | ICD-10-CM

## 2021-09-12 MED ORDER — SPACER/AERO-HOLDING CHAMBERS DEVI: 0 refills | Status: DC

## 2021-09-12 MED ORDER — BUDESONIDE-FORMOTEROL FUMARATE 160-4.5 MCG/ACT IN AERO: 2.0000 | INHALATION_SPRAY | Freq: Two times a day (BID) | RESPIRATORY_TRACT | 12 refills | Status: DC

## 2021-09-12 MED ORDER — AEROCHAMBER MV MISC: 0 refills | Status: DC

## 2021-09-12 NOTE — Patient Instructions (Addendum)
-   START taking SYMBICORT 2 puffs twice daily EVERYDAY with spacer. Rinse mouth and spacer after each use to prevent thrush. - STOP taking FLOVENT - CAN CONTINUE to use albuterol 1-2 puffs every 6 hours AS NEEDED ONLY. Ideally we get you to a place where you just need this maybe once a week or less - See you in late July or sooner as needed!

## 2021-09-13 ENCOUNTER — Ambulatory Visit: Payer: Medicaid Other | Admitting: Family Medicine

## 2021-09-13 LAB — CBC WITH DIFFERENTIAL/PLATELET
Basophils Absolute: 0.1 10*3/uL (ref 0.0–0.1)
Basophils Relative: 0.8 % (ref 0.0–3.0)
Eosinophils Absolute: 0.2 10*3/uL (ref 0.0–0.7)
Eosinophils Relative: 3.7 % (ref 0.0–5.0)
HCT: 37.8 % (ref 36.0–46.0)
Hemoglobin: 12.7 g/dL (ref 12.0–15.0)
Lymphocytes Relative: 46.2 % — ABNORMAL HIGH (ref 12.0–46.0)
Lymphs Abs: 3 10*3/uL (ref 0.7–4.0)
MCHC: 33.5 g/dL (ref 30.0–36.0)
MCV: 83.1 fl (ref 78.0–100.0)
Monocytes Absolute: 0.5 10*3/uL (ref 0.1–1.0)
Monocytes Relative: 7.2 % (ref 3.0–12.0)
Neutro Abs: 2.7 10*3/uL (ref 1.4–7.7)
Neutrophils Relative %: 42.1 % — ABNORMAL LOW (ref 43.0–77.0)
Platelets: 332 10*3/uL (ref 150.0–400.0)
RBC: 4.54 Mil/uL (ref 3.87–5.11)
RDW: 12.8 % (ref 11.5–14.6)
WBC: 6.4 10*3/uL (ref 4.5–10.5)

## 2021-09-13 LAB — IGE: IgE (Immunoglobulin E), Serum: 485 kU/L — ABNORMAL HIGH (ref <=114)

## 2021-09-13 MED ORDER — AEROCHAMBER MV MISC
0 refills | Status: DC
Start: 2021-09-13 — End: 2023-07-10

## 2021-09-13 NOTE — Progress Notes (Deleted)
    SUBJECTIVE:   CHIEF COMPLAINT: follow up  HPI:   Amy Petersen is a 21 y.o.  with history notable for persistent asthma presenting for ***.  She reports ***.    PERTINENT  PMH / PSH/Family/Social History :persistent asthma with mild obstruction on PFT per Pulmonary   OBJECTIVE:   There were no vitals taken for this visit.  Today's weight:  Review of prior weights: There were no vitals filed for this visit.   Cardiac: Regular rate and rhythm. Normal S1/S2. No murmurs, rubs, or gallops appreciated. Lungs: Clear bilaterally to ascultation.  Abdomen: Normoactive bowel sounds. No tenderness to deep or light palpation. No rebound or guarding.    Psych: Pleasant and appropriate    ASSESSMENT/PLAN:   No problem-specific Assessment & Plan notes found for this encounter.     {    This will disappear when note is signed, click to select method of visit    :1}  Dorris Singh, [name]

## 2021-09-16 IMAGING — CR DG CHEST 2V
2 series · 2 of 2 positions shown · non-contrast
Comparison: No pertinent prior exams available for comparison.

CLINICAL DATA: Shortness of breath. Additional history provided by
technologist: Patient reports shortness of breath, midline chest
pressure, cough "off and on" for approximately 2 months.

EXAM:
CHEST - 2 VIEW

[w chest pa]
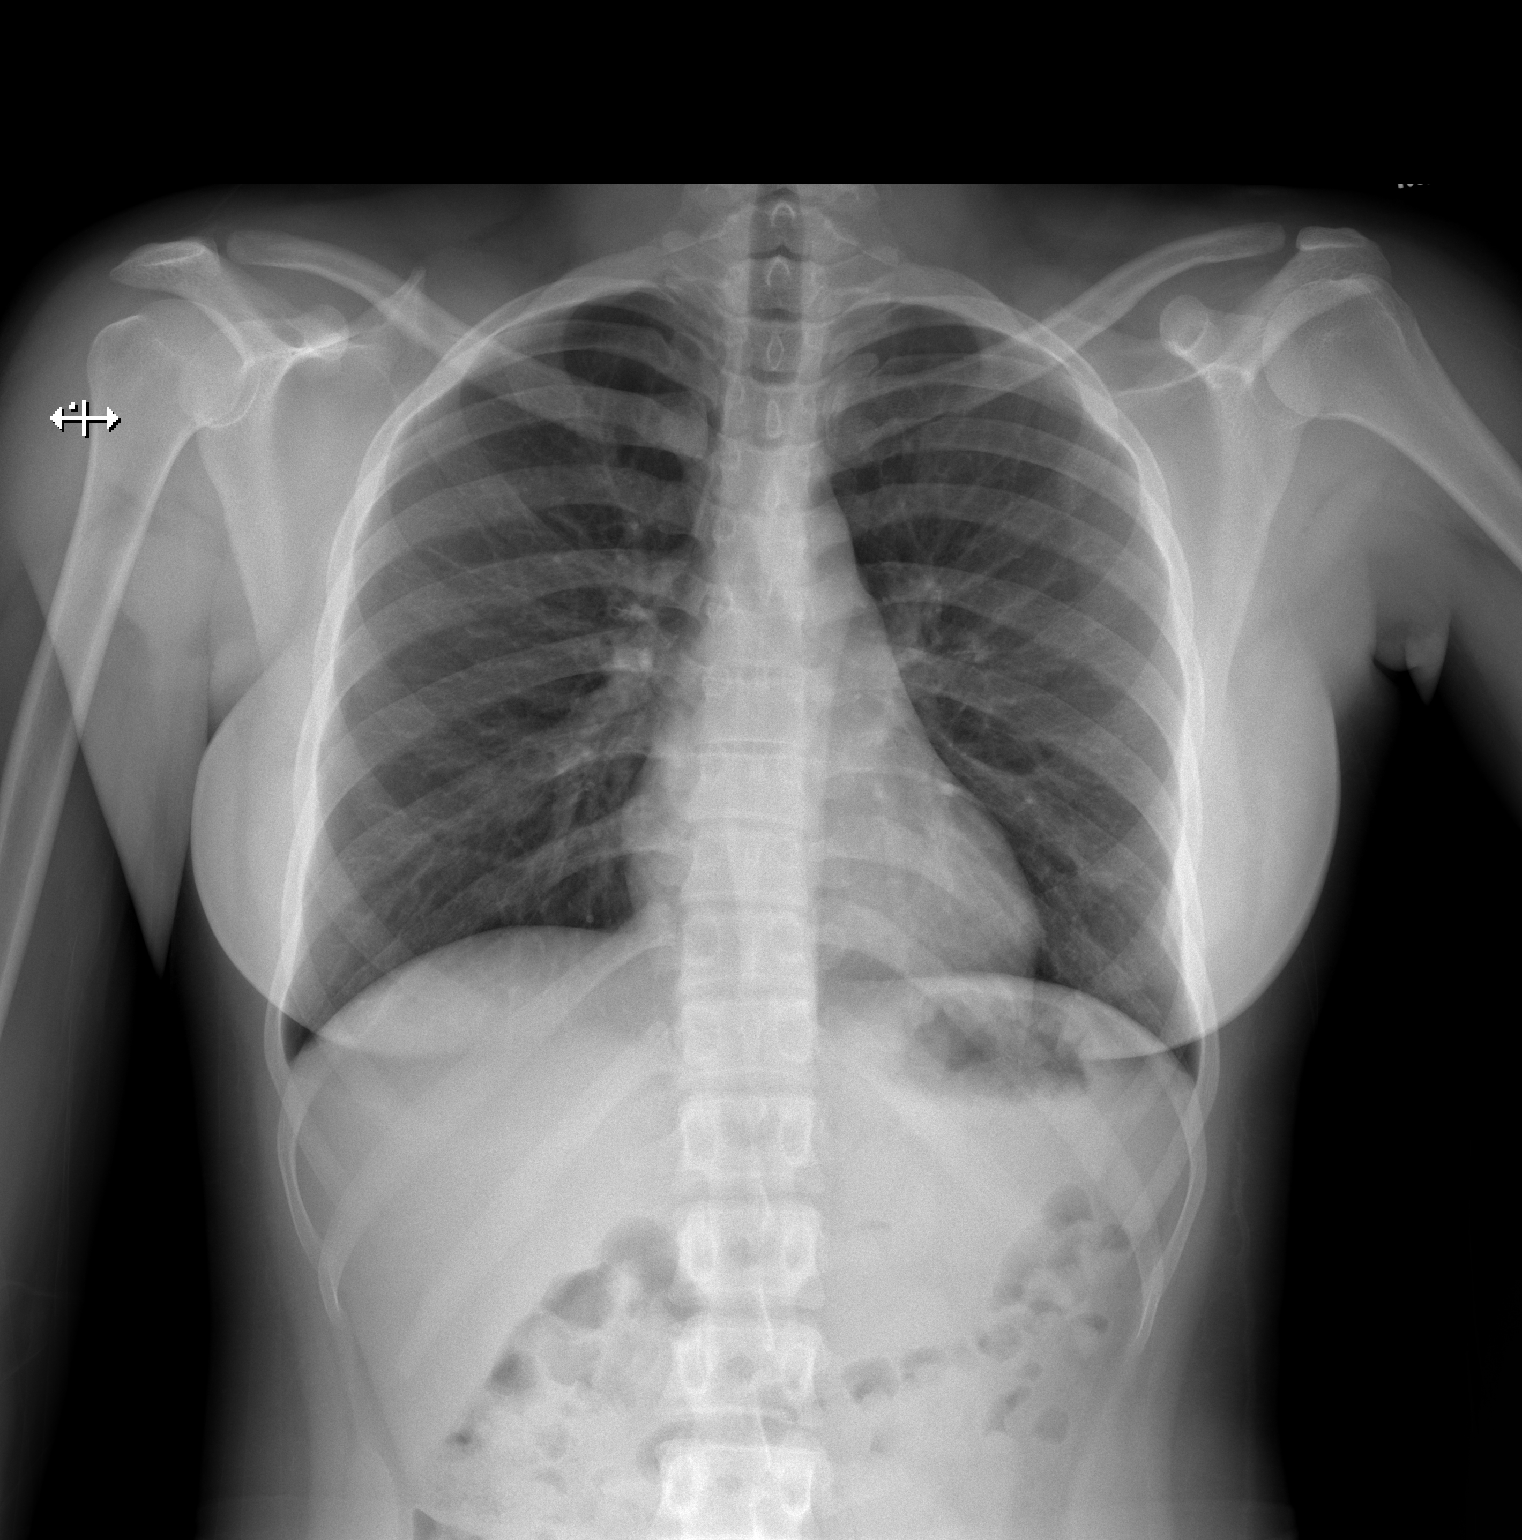

[w chest lat]
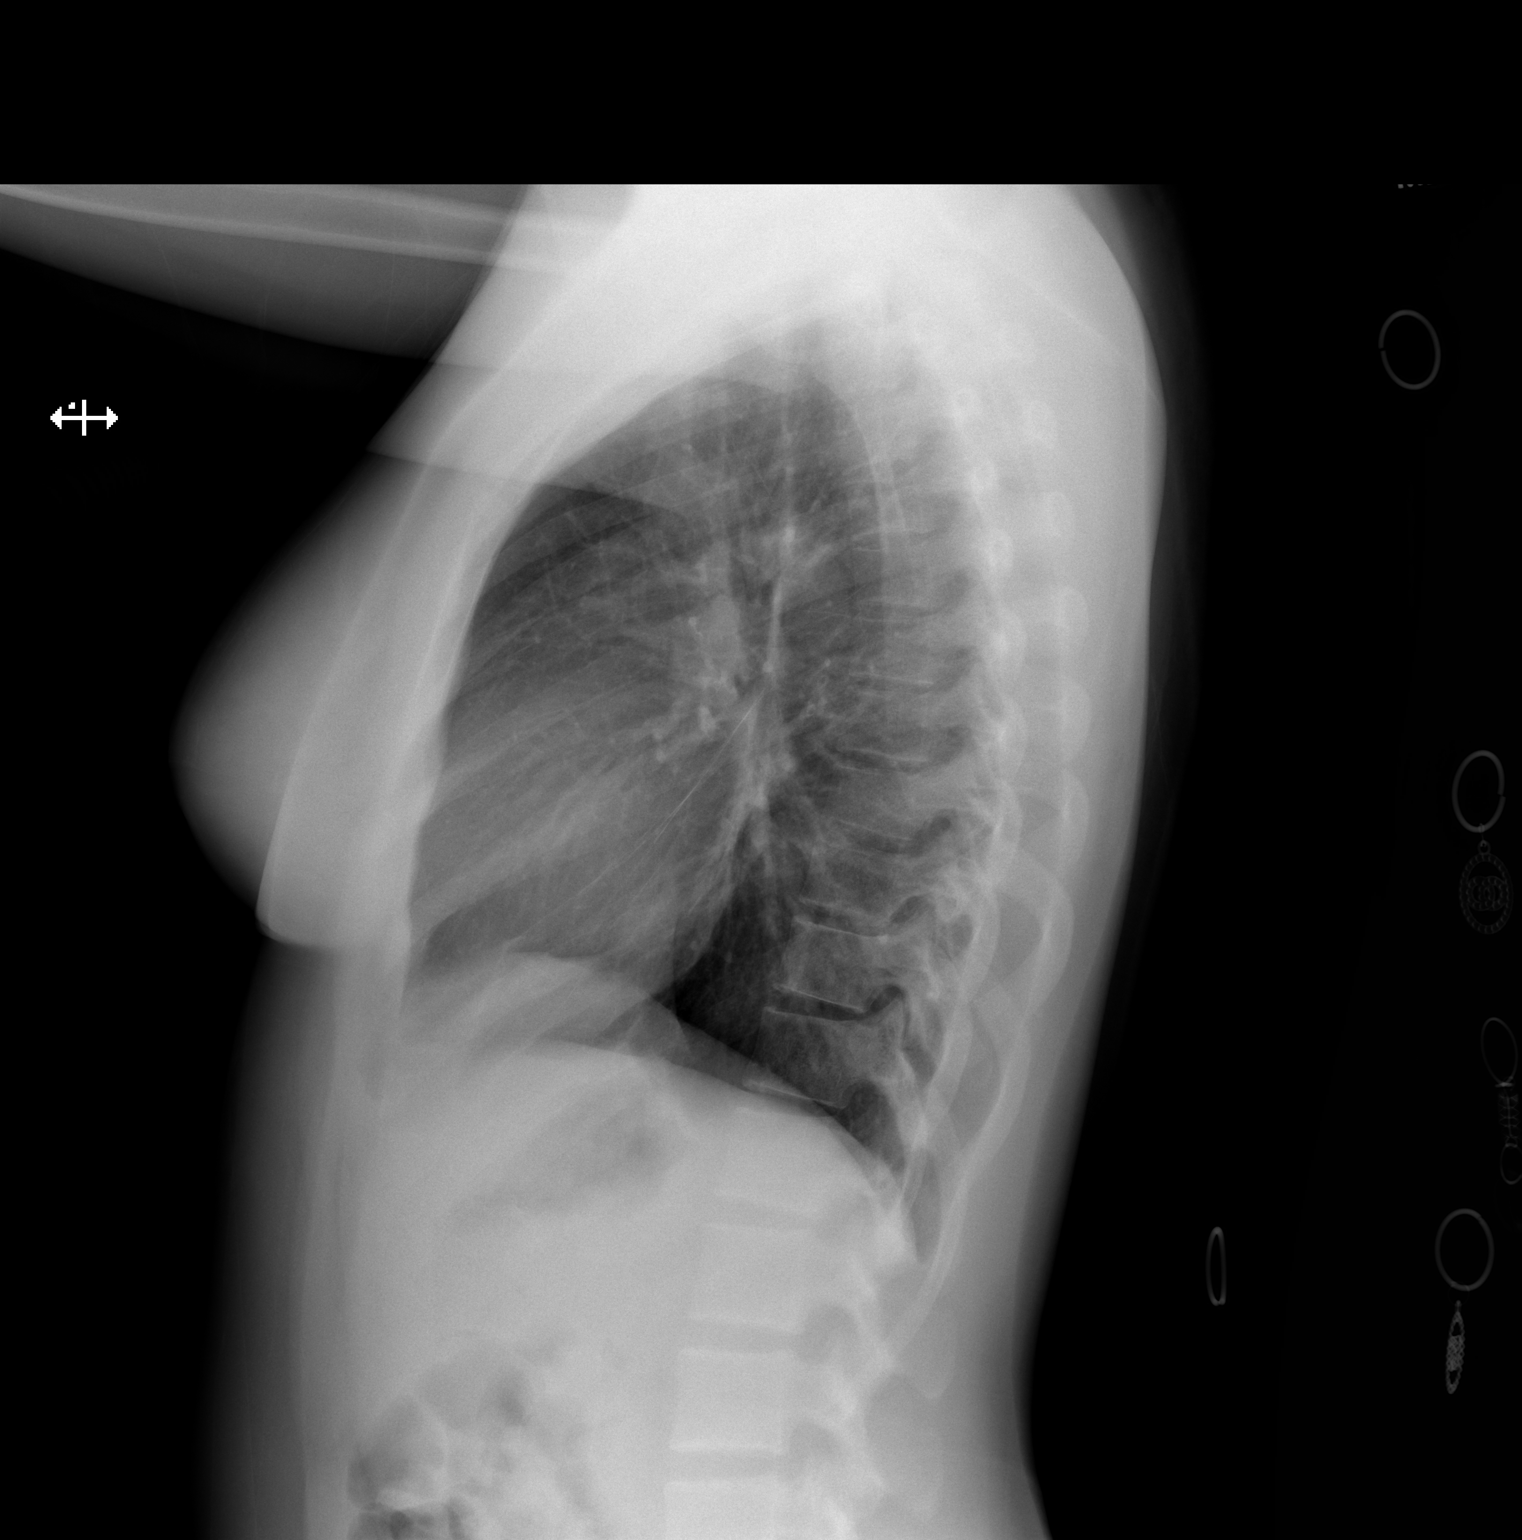

[2 of 2 positions shown; findings below may reference images not displayed]

FINDINGS: Heart size within normal limits. No appreciable airspace
consolidation. No evidence of pleural effusion or pneumothorax. No
acute bony abnormality identified.
IMPRESSION: No evidence of active cardiopulmonary disease.

## 2021-09-24 ENCOUNTER — Encounter: Payer: Self-pay | Admitting: *Deleted

## 2021-11-02 ENCOUNTER — Emergency Department (HOSPITAL_COMMUNITY)
Admission: EM | Admit: 2021-11-02 | Discharge: 2021-11-03 | Discharge status: Against Medical Advice | Payer: Medicaid Other | Attending: Emergency Medicine | Admitting: Emergency Medicine

## 2021-11-02 ENCOUNTER — Encounter (HOSPITAL_COMMUNITY): Payer: Self-pay

## 2021-11-02 DIAGNOSIS — N83201 Unspecified ovarian cyst, right side: Secondary | ICD-10-CM | POA: Insufficient documentation

## 2021-11-02 DIAGNOSIS — Z5321 Procedure and treatment not carried out due to patient leaving prior to being seen by health care provider: Secondary | ICD-10-CM | POA: Diagnosis not present

## 2021-11-02 DIAGNOSIS — R1030 Lower abdominal pain, unspecified: Secondary | ICD-10-CM | POA: Diagnosis not present

## 2021-11-02 LAB — COMPREHENSIVE METABOLIC PANEL
ALT: 11 U/L (ref 0–44)
AST: 18 U/L (ref 15–41)
Albumin: 4.1 g/dL (ref 3.5–5.0)
Alkaline Phosphatase: 40 U/L (ref 38–126)
Anion gap: 9 (ref 5–15)
BUN: 11 mg/dL (ref 6–20)
CO2: 22 mmol/L (ref 22–32)
Calcium: 9.1 mg/dL (ref 8.9–10.3)
Chloride: 103 mmol/L (ref 98–111)
Creatinine, Ser: 0.7 mg/dL (ref 0.44–1.00)
GFR, Estimated: >60 mL/min (ref >60)
Glucose, Bld: 81 mg/dL (ref 70–99)
Potassium: 4.1 mmol/L (ref 3.5–5.1)
Sodium: 134 mmol/L — ABNORMAL LOW (ref 135–145)
Total Bilirubin: 0.4 mg/dL (ref 0.3–1.2)
Total Protein: 7.3 g/dL (ref 6.5–8.1)

## 2021-11-02 LAB — CBC
HCT: 34.8 % — ABNORMAL LOW (ref 36.0–46.0)
Hemoglobin: 11.7 g/dL — ABNORMAL LOW (ref 12.0–15.0)
MCH: 27.9 pg (ref 26.0–34.0)
MCHC: 33.6 g/dL (ref 30.0–36.0)
MCV: 83.1 fL (ref 80.0–100.0)
Platelets: 314 10*3/uL (ref 150–400)
RBC: 4.19 MIL/uL (ref 3.87–5.11)
RDW: 12.5 % (ref 11.5–15.5)
WBC: 7.2 10*3/uL (ref 4.0–10.5)
nRBC: 0 % (ref 0.0–0.2)

## 2021-11-02 LAB — URINALYSIS, ROUTINE W REFLEX MICROSCOPIC
Bilirubin Urine: NEGATIVE
Glucose, UA: NEGATIVE mg/dL
Hgb urine dipstick: NEGATIVE
Ketones, ur: NEGATIVE mg/dL
Leukocytes,Ua: NEGATIVE
Nitrite: NEGATIVE
Protein, ur: NEGATIVE mg/dL
Specific Gravity, Urine: 1.008 (ref 1.005–1.030)
pH: 6 (ref 5.0–8.0)

## 2021-11-02 LAB — I-STAT BETA HCG BLOOD, ED (MC, WL, AP ONLY): I-stat hCG, quantitative: <5 m[IU]/mL (ref <5)

## 2021-11-02 LAB — LIPASE, BLOOD: Lipase: 31 U/L (ref 11–51)

## 2021-11-02 NOTE — ED Triage Notes (Signed)
Pt reports that since Thursday she has been having lower abd pain, denies n/v/d, denies urinary symptoms or discharge.

## 2021-11-03 ENCOUNTER — Encounter (HOSPITAL_BASED_OUTPATIENT_CLINIC_OR_DEPARTMENT_OTHER): Payer: Self-pay

## 2021-11-03 ENCOUNTER — Emergency Department (HOSPITAL_BASED_OUTPATIENT_CLINIC_OR_DEPARTMENT_OTHER): Payer: Medicaid Other

## 2021-11-03 ENCOUNTER — Emergency Department (HOSPITAL_BASED_OUTPATIENT_CLINIC_OR_DEPARTMENT_OTHER)
Admission: EM | Admit: 2021-11-03 | Discharge: 2021-11-03 | Disposition: A | Discharge status: Home / Self Care | Payer: Medicaid Other | Source: Home / Self Care | Attending: Emergency Medicine | Admitting: Emergency Medicine

## 2021-11-03 ENCOUNTER — Other Ambulatory Visit: Payer: Self-pay

## 2021-11-03 DIAGNOSIS — K388 Other specified diseases of appendix: Secondary | ICD-10-CM | POA: Diagnosis not present

## 2021-11-03 DIAGNOSIS — R102 Pelvic and perineal pain: Secondary | ICD-10-CM

## 2021-11-03 DIAGNOSIS — N83201 Unspecified ovarian cyst, right side: Secondary | ICD-10-CM | POA: Insufficient documentation

## 2021-11-03 MED ORDER — HYDROCODONE-ACETAMINOPHEN 5-325 MG PO TABS: 1.0000 | ORAL_TABLET | Freq: Four times a day (QID) | ORAL | 0 refills | Status: DC | PRN

## 2021-11-03 MED ORDER — IOHEXOL 300 MG/ML  SOLN
100.0000 mL | Freq: Once | INTRAMUSCULAR | Status: AC | PRN
  Administered 2021-11-03: 80 mL via INTRAVENOUS

## 2021-11-03 MED ORDER — MEGESTROL ACETATE 40 MG PO TABS: 40.0000 mg | ORAL_TABLET | Freq: Every day | ORAL | 0 refills | Status: AC

## 2021-11-03 NOTE — ED Provider Notes (Signed)
[facility] EMERGENCY DEPT  Provider Note  CSN: [id number] Arrival date & time: 11/03/21 0324  History Chief Complaint  Patient presents with   Abdominal Pain    Amy Petersen is a 21 y.o. female with no significant PMH reports 2-3 days of intermittent, gradually worsening lower abdominal pains R>L, not associated with vomiting, diarrhea or dysuria. She has been taking OTC pain meds with variable effectiveness. She was at the [facility] earlier tonight but pain resolved while she was waiting and so she decided to go home. She reports pain returned prompting her to come here for evaluation.    Home Medications Prior to Admission medications   Medication Sig Start Date End Date Taking? Authorizing Provider  HYDROcodone-acetaminophen (NORCO/VICODIN) 5-325 MG tablet Take 1 tablet by mouth every 6 (six) hours as needed for severe pain. 11/03/21  Yes Pollyann Savoy, MD  megestrol (MEGACE) 40 MG tablet Take 1 tablet (40 mg total) by mouth daily. 11/03/21 12/03/21 Yes Pollyann Savoy, MD  albuterol (VENTOLIN HFA) 108 (90 Base) MCG/ACT inhaler Inhale 2 puffs into the lungs every 6 (six) hours as needed for wheezing or shortness of breath. 02/22/21   Sabino Dick, DO  budesonide-formoterol (SYMBICORT) 160-4.5 MCG/ACT inhaler Inhale 2 puffs into the lungs in the morning and at bedtime. Patient not taking: Reported on 11/02/2021 09/12/21   Omar Person, MD  Ibuprofen 200 MG CAPS Take 1 capsule by mouth daily as needed (for pain).    [provider]  Spacer/Aero-Holding Chambers (AEROCHAMBER MV) inhaler Use as instructed 09/13/21   Omar Person, MD  Spacer/Aero-Holding Rudean Curt Use as directed 09/12/21   Omar Person, MD     Allergies    Patient has no known allergies.   Review of Systems   Review of Systems Please see HPI for pertinent positives and negatives  Physical Exam BP 106/61 (BP Location: Right Arm)   Pulse 60   Temp 98.2 F (36.8  C) (Oral)   Resp 18   Ht 5\' 3"  (1.6 m)   Wt 63.5 kg   LMP  (LMP Unknown)   SpO2 98%   BMI 24.80 kg/m   Physical Exam Vitals and nursing note reviewed.  Constitutional:      Appearance: Normal appearance.  HENT:     Head: Normocephalic and atraumatic.     Nose: Nose normal.     Mouth/Throat:     Mouth: Mucous membranes are moist.  Eyes:     Extraocular Movements: Extraocular movements intact.     Conjunctiva/sclera: Conjunctivae normal.  Cardiovascular:     Rate and Rhythm: Normal rate.  Pulmonary:     Effort: Pulmonary effort is normal.     Breath sounds: Normal breath sounds.  Abdominal:     General: Abdomen is flat.     Palpations: Abdomen is soft.     Tenderness: There is abdominal tenderness in the right lower quadrant. There is no guarding. Negative signs include Murphy's sign and McBurney's sign.  Musculoskeletal:        General: No swelling. Normal range of motion.     Cervical back: Neck supple.  Skin:    General: Skin is warm and dry.  Neurological:     General: No focal deficit present.     Mental Status: She is alert.  Psychiatric:        Mood and Affect: Mood normal.     ED Results / Procedures / Treatments   EKG None  Procedures Procedures  Medications Ordered in the ED Medications  iohexol (OMNIPAQUE) 300 MG/ML solution 100 mL (80 mLs Intravenous Contrast Given 11/03/21 0406)    Initial Impression and Plan  Patient with intermittent lower abdominal pain. Labs done at [facility] earlier tonight showed normal CBC, CMP, Lipase, UA and hcg. I have a low suspicion for appendicitis given her intermittent symptoms and lack of peritoneal signs on exam. More likely would be ovarian cyst or intermittent torsion, unfortunately I do not have [place] available at this time. Will send for CT and reassess.   ED Course   Clinical Course as of 11/03/21 2094  Wynelle Link Nov 03, 2021  0503 I personally viewed the images from radiology studies and agree with radiologist  interpretation: CT is neg for appendicitis. There is a simple cyst on the right ovary. I spoke with Dr. Despina Hidden, Ob/Gyn who does not recommend [place] given benign appearance on CT. He recommends starting Megace 40mg  daily for a month, pain medications as needed and follow up at [facility]. Patient amenable to this plan.   [CS]    Clinical Course User Index [CS] , MD     MDM Rules/Calculators/A&P Medical Decision Making Problems Addressed: Cyst of right ovary: acute illness or injury Pelvic pain: acute illness or injury  Amount and/or Complexity of Data Reviewed Labs:  Decision-making details documented in ED Course. Radiology: ordered and independent interpretation performed. Decision-making details documented in ED Course.  Risk Prescription drug management.    Final Clinical Impression(s) / ED Diagnoses Final diagnoses:  Cyst of right ovary  Pelvic pain    Rx / DC Orders ED Discharge Orders          Ordered    megestrol (MEGACE) 40 MG tablet  Daily        11/03/21 0508    HYDROcodone-acetaminophen (NORCO/VICODIN) 5-325 MG tablet  Every 6 hours PRN        11/03/21 0508             11/05/21, MD 11/03/21 [phone]

## 2021-11-03 NOTE — ED Notes (Signed)
PT opting to leave at this time 

## 2021-11-03 NOTE — ED Triage Notes (Signed)
Lower abdominal pain since Thursday was seen at [facility], but left before receiving diagnosis.

## 2021-11-06 NOTE — Progress Notes (Deleted)
Synopsis: Referred for dyspnea by Westley Chandler, MD  Subjective:   PATIENT ID: Amy Petersen GENDER: female DOB: [birth year], MRN: [id number]  No chief complaint on file.  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 She did get covid-19 but was mild infection.   Episodic dyspnea, chest tightness over last couple of years. The albuterol inhaler helps. She says she is using flovent daily 3-4 puffs every night. Doesn't rinse mouth after use. She says she got it over the counter. She does think the flovent has been helpful. She doesn't have much of a cough.   No sinus congestion, PND, gerd.  No family history of lung disease.  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 Never smoker, MJ, vaping  Interval HPI:  Started on symbicort last visit  Otherwise pertinent review of systems is negative.  Past Medical History:  Diagnosis Date   Asthma    Malaria    Recurrent UTI    Typhoid      No family history on file.   Past Surgical History:  Procedure Laterality Date   THROAT SURGERY      Social History   Socioeconomic History   Marital status: Single    Spouse name: Not on file   Number of children: Not on file   Years of education: Not on file   Highest education level: Not on file  Occupational History   Not on file  Tobacco Use   Smoking status: Never   Smokeless tobacco: Never  Vaping Use   Vaping Use: Never used  Substance and Sexual Activity   Alcohol use: Never   Drug use: Never   Sexual activity: Never  Other Topics Concern   Not on file  Social History Narrative   Refugee Information   Number of Immediate Family Members: 2   Number of Immediate Family Members in [place]: 2(Katherine Mukaraubayiza)   Date of Arrival: 01/04/19   Country of Birth: [facility])   Country of Origin: ([place]   Location of Refugee Camp: [place]   Reason  for Leaving Home Country: Political opinion   Primary Language: Kinyarwanda/[place], [place], English   Able to Read in Primary Language: Yes   Able to Write in Primary Language: Yes   Education: High School(10th grade in [place]         Moved from [place] in September 2020    Celine Ahr is legal guardian    Social Determinants of Corporate investment banker Strain: Not on file  Food Insecurity: Not on file  Transportation Needs: Not on file  Physical Activity: Not on file  Stress: Not on file  Social Connections: Not on file  Intimate Partner Violence: Not on file     No Known Allergies   Outpatient Medications Prior to Visit  Medication Sig Dispense Refill   albuterol (VENTOLIN HFA) 108 (90 Base) MCG/ACT inhaler Inhale 2 puffs into the lungs every 6 (six) hours as needed for wheezing or shortness of breath. 8 g 2   budesonide-formoterol (SYMBICORT) 160-4.5 MCG/ACT inhaler Inhale 2 puffs into the lungs in the morning and at bedtime. (Patient not taking: Reported on 11/02/2021) 1 each 12   HYDROcodone-acetaminophen (NORCO/VICODIN) 5-325 MG tablet Take 1 tablet by mouth every 6 (six) hours as needed for severe pain. 12 tablet 0   Ibuprofen 200 MG CAPS Take 1 capsule by mouth daily as needed (  for pain).     megestrol (MEGACE) 40 MG tablet Take 1 tablet (40 mg total) by mouth daily. 30 tablet 0   Spacer/Aero-Holding Chambers (AEROCHAMBER MV) inhaler Use as instructed 1 each 0   Spacer/Aero-Holding Chambers DEVI Use as directed 1 each 0   Facility-Administered Medications Prior to Visit  Medication Dose Route Frequency Provider Last Rate Last Admin   etonogestrel (NEXPLANON) implant 68 mg  68 mg Subdermal Once Autry-Lott, Simone, DO           Objective:   Physical Exam:  General appearance: 21 y.o., female, NAD, conversant  Eyes: anicteric sclerae; PERRL, tracking appropriately HENT: NCAT; MMM Neck: Trachea midline; no lymphadenopathy, no JVD Lungs: CTAB, no crackles, no wheeze,  with normal respiratory effort CV: RRR, no murmur  Abdomen: Soft, non-tender; non-distended, BS present  Extremities: No peripheral edema, warm Skin: Normal turgor and texture; no rash Psych: Appropriate affect Neuro: Alert and oriented to person and place, no focal deficit     There were no vitals filed for this visit.    on RA BMI Readings from Last 3 Encounters:  11/03/21 24.80 kg/m  09/12/21 24.62 kg/m  05/23/21 25.33 kg/m   Wt Readings from Last 3 Encounters:  11/03/21 140 lb (63.5 kg)  09/12/21 139 lb (63 kg)  05/23/21 143 lb (64.9 kg)     CBC    Component Value Date/Time   WBC 7.2 11/02/2021 2120   RBC 4.19 11/02/2021 2120   HGB 11.7 (L) 11/02/2021 2120   HGB 13.3 05/15/2021 0930   HCT 34.8 (L) 11/02/2021 2120   HCT 39.3 05/15/2021 0930   PLT 314 11/02/2021 2120   PLT 349 05/15/2021 0930   MCV 83.1 11/02/2021 2120   MCV 82 05/15/2021 0930   MCH 27.9 11/02/2021 2120   MCHC 33.6 11/02/2021 2120   RDW 12.5 11/02/2021 2120   RDW 12.5 05/15/2021 0930   LYMPHSABS 3.0 09/12/2021 1530   LYMPHSABS 2.7 02/22/2019 1641   MONOABS 0.5 09/12/2021 1530   EOSABS 0.2 09/12/2021 1530   EOSABS 0.2 02/22/2019 1641   BASOSABS 0.1 09/12/2021 1530   BASOSABS 0.0 02/22/2019 1641   Eos 200 IgE 485  D-dimer 05/15/21 normal   Chest Imaging: CXR 05/15/21 unremarkable  Pulmonary Functions Testing Results:     No data to display         Cleda Daub 05/23/21 with moderate obstruction, reduced FVC, no significant BD response        Assessment & Plan:   # uncontrolled persistent asthma  Plan: - continue SYMBICORT 2 puffs twice daily EVERYDAY with spacer. Rinse mouth and spacer after each use to prevent thrush. - CAN CONTINUE to use albuterol 1-2 puffs every 6 hours AS NEEDED ONLY - See you in late July or sooner as needed!     Omar Person, MD [facility] 11/06/2021 4:54 PM

## 2021-11-08 ENCOUNTER — Ambulatory Visit: Payer: Medicaid Other | Admitting: Student

## 2021-11-11 ENCOUNTER — Ambulatory Visit: Payer: Medicaid Other | Admitting: Family Medicine

## 2021-11-11 NOTE — Progress Notes (Unsigned)
    SUBJECTIVE:   CHIEF COMPLAINT / HPI:   Abdominal Pain Recently seen in ED 7/16 and found to have simple ovarian cyst. OBGYN recommended megace 40 mg daily for 1 month and to follow up at [facility]  PERTINENT  PMH / PSH: Nexplanon in place, moderate persistent asthma  OBJECTIVE:   LMP  (LMP Unknown)   ***  ASSESSMENT/PLAN:   No problem-specific Assessment & Plan notes found for this encounter.     Levin Erp, MD [facility]

## 2021-11-12 ENCOUNTER — Ambulatory Visit (INDEPENDENT_AMBULATORY_CARE_PROVIDER_SITE_OTHER): Payer: Medicaid Other | Admitting: Student

## 2021-11-12 ENCOUNTER — Ambulatory Visit: Payer: Medicaid Other | Admitting: Student

## 2021-11-12 ENCOUNTER — Other Ambulatory Visit (HOSPITAL_COMMUNITY)
Admission: RE | Admit: 2021-11-12 | Discharge: 2021-11-12 | Disposition: A | Discharge status: Home / Self Care | Payer: Medicaid Other | Source: Ambulatory Visit | Attending: Family Medicine | Admitting: Family Medicine

## 2021-11-12 VITALS — BP 108/62 | HR 62 | Ht 63.0 in | Wt 137.0 lb

## 2021-11-12 DIAGNOSIS — N83201 Unspecified ovarian cyst, right side: Secondary | ICD-10-CM | POA: Diagnosis not present

## 2021-11-12 DIAGNOSIS — R109 Unspecified abdominal pain: Secondary | ICD-10-CM | POA: Insufficient documentation

## 2021-11-12 LAB — POCT URINE PREGNANCY: Preg Test, Ur: NEGATIVE

## 2021-11-12 NOTE — Patient Instructions (Signed)
It was great to see you! Thank you for allowing me to participate in your care!   I recommend that you always bring your medications to each appointment as this makes it easy to ensure we are on the correct medications and helps [place] not miss when refills are needed.  Our plans for today:  - I will follow up with you on the results - I will refer you to OBGYN for follow up of ovarian cyst  We are checking some labs today, I will call you if they are abnormal will send you a MyChart message or a letter if they are normal.  If you do not hear about your labs in the next 2 weeks please let us know.  Take care and seek immediate care sooner if you develop any concerns. Please remember to show up 15 minutes before your scheduled appointment time!  Levin Erp, MD [facility]

## 2021-11-12 NOTE — Assessment & Plan Note (Addendum)
Hx of abdominal cramping and ED visit on 7/16, has been improved and currently on period. Continuing megace daily and per ED note needs to follow up with [facility]. Discussed nexplanon likely not causing her cyst. Will check G/C today. POCT urine negative.  - Cervicovaginal ancillary only - Ambulatory referral to Gynecology

## 2021-11-13 LAB — CERVICOVAGINAL ANCILLARY ONLY
Bacterial Vaginitis (gardnerella): POSITIVE — AB
Candida Glabrata: NEGATIVE
Candida Vaginitis: NEGATIVE
Chlamydia: NEGATIVE
Comment: NEGATIVE
Comment: NEGATIVE
Comment: NEGATIVE
Comment: NEGATIVE
Comment: NEGATIVE
Comment: NORMAL
Neisseria Gonorrhea: NEGATIVE
Trichomonas: NEGATIVE

## 2021-11-14 ENCOUNTER — Encounter: Payer: Self-pay | Admitting: Student

## 2022-01-11 ENCOUNTER — Emergency Department (HOSPITAL_BASED_OUTPATIENT_CLINIC_OR_DEPARTMENT_OTHER)
Admission: EM | Admit: 2022-01-11 | Discharge: 2022-01-11 | Disposition: A | Discharge status: Home / Self Care | Payer: Medicaid Other | Attending: Emergency Medicine | Admitting: Emergency Medicine

## 2022-01-11 ENCOUNTER — Encounter (HOSPITAL_BASED_OUTPATIENT_CLINIC_OR_DEPARTMENT_OTHER): Payer: Self-pay

## 2022-01-11 DIAGNOSIS — R630 Anorexia: Secondary | ICD-10-CM | POA: Insufficient documentation

## 2022-01-11 DIAGNOSIS — Y903 Blood alcohol level of 60-79 mg/100 ml: Secondary | ICD-10-CM | POA: Diagnosis not present

## 2022-01-11 DIAGNOSIS — G4700 Insomnia, unspecified: Secondary | ICD-10-CM | POA: Diagnosis not present

## 2022-01-11 DIAGNOSIS — F322 Major depressive disorder, single episode, severe without psychotic features: Secondary | ICD-10-CM

## 2022-01-11 DIAGNOSIS — Z20822 Contact with and (suspected) exposure to covid-19: Secondary | ICD-10-CM | POA: Insufficient documentation

## 2022-01-11 DIAGNOSIS — F321 Major depressive disorder, single episode, moderate: Secondary | ICD-10-CM | POA: Insufficient documentation

## 2022-01-11 DIAGNOSIS — Z79899 Other long term (current) drug therapy: Secondary | ICD-10-CM | POA: Diagnosis not present

## 2022-01-11 DIAGNOSIS — F32A Depression, unspecified: Secondary | ICD-10-CM | POA: Diagnosis not present

## 2022-01-11 LAB — CBC WITH DIFFERENTIAL/PLATELET
Abs Immature Granulocytes: 0.01 10*3/uL (ref 0.00–0.07)
Basophils Absolute: 0 10*3/uL (ref 0.0–0.1)
Basophils Relative: 0 %
Eosinophils Absolute: 0 10*3/uL (ref 0.0–0.5)
Eosinophils Relative: 0 %
HCT: 36.7 % (ref 36.0–46.0)
Hemoglobin: 12.7 g/dL (ref 12.0–15.0)
Immature Granulocytes: 0 %
Lymphocytes Relative: 21 %
Lymphs Abs: 1.9 10*3/uL (ref 0.7–4.0)
MCH: 27.9 pg (ref 26.0–34.0)
MCHC: 34.6 g/dL (ref 30.0–36.0)
MCV: 80.5 fL (ref 80.0–100.0)
Monocytes Absolute: 0.4 10*3/uL (ref 0.1–1.0)
Monocytes Relative: 5 %
Neutro Abs: 6.7 10*3/uL (ref 1.7–7.7)
Neutrophils Relative %: 74 %
Platelets: 343 10*3/uL (ref 150–400)
RBC: 4.56 MIL/uL (ref 3.87–5.11)
RDW: 12.3 % (ref 11.5–15.5)
WBC: 9.1 10*3/uL (ref 4.0–10.5)
nRBC: 0 % (ref 0.0–0.2)

## 2022-01-11 LAB — COMPREHENSIVE METABOLIC PANEL
ALT: 19 U/L (ref 0–44)
AST: 17 U/L (ref 15–41)
Albumin: 4.7 g/dL (ref 3.5–5.0)
Alkaline Phosphatase: 37 U/L — ABNORMAL LOW (ref 38–126)
Anion gap: 11 (ref 5–15)
BUN: 13 mg/dL (ref 6–20)
CO2: 23 mmol/L (ref 22–32)
Calcium: 10 mg/dL (ref 8.9–10.3)
Chloride: 106 mmol/L (ref 98–111)
Creatinine, Ser: 0.89 mg/dL (ref 0.44–1.00)
GFR, Estimated: >60 mL/min (ref >60)
Glucose, Bld: 121 mg/dL — ABNORMAL HIGH (ref 70–99)
Potassium: 3.6 mmol/L (ref 3.5–5.1)
Sodium: 140 mmol/L (ref 135–145)
Total Bilirubin: 0.2 mg/dL — ABNORMAL LOW (ref 0.3–1.2)
Total Protein: 8 g/dL (ref 6.5–8.1)

## 2022-01-11 LAB — RAPID URINE DRUG SCREEN, HOSP PERFORMED
Amphetamines: NOT DETECTED
Barbiturates: NOT DETECTED
Benzodiazepines: NOT DETECTED
Cocaine: NOT DETECTED
Opiates: NOT DETECTED
Tetrahydrocannabinol: NOT DETECTED

## 2022-01-11 LAB — URINALYSIS, ROUTINE W REFLEX MICROSCOPIC
Bilirubin Urine: NEGATIVE
Glucose, UA: NEGATIVE mg/dL
Hgb urine dipstick: NEGATIVE
Ketones, ur: NEGATIVE mg/dL
Leukocytes,Ua: NEGATIVE
Nitrite: NEGATIVE
Protein, ur: NEGATIVE mg/dL
Specific Gravity, Urine: 1.015 (ref 1.005–1.030)
pH: 6 (ref 5.0–8.0)

## 2022-01-11 LAB — SALICYLATE LEVEL: Salicylate Lvl: <7 mg/dL — ABNORMAL LOW (ref 7.0–30.0)

## 2022-01-11 LAB — ACETAMINOPHEN LEVEL: Acetaminophen (Tylenol), Serum: <10 ug/mL — ABNORMAL LOW (ref 10–30)

## 2022-01-11 LAB — ETHANOL: Alcohol, Ethyl (B): 79 mg/dL — ABNORMAL HIGH (ref <10)

## 2022-01-11 LAB — RESP PANEL BY RT-PCR (FLU A&B, COVID) ARPGX2
Influenza A by PCR: NEGATIVE
Influenza B by PCR: NEGATIVE
SARS Coronavirus 2 by RT PCR: NEGATIVE

## 2022-01-11 LAB — PREGNANCY, URINE: Preg Test, Ur: NEGATIVE

## 2022-01-11 MED ORDER — ONDANSETRON HCL 4 MG PO TABS: 4.0000 mg | ORAL_TABLET | Freq: Three times a day (TID) | ORAL | Status: DC | PRN

## 2022-01-11 MED ORDER — ACETAMINOPHEN 325 MG PO TABS: 650.0000 mg | ORAL_TABLET | ORAL | Status: DC | PRN

## 2022-01-11 MED ORDER — LACTATED RINGERS IV BOLUS
1000.0000 mL | Freq: Once | INTRAVENOUS | Status: AC
  Administered 2022-01-11: 1000 mL via INTRAVENOUS

## 2022-01-11 MED ORDER — ALUM & MAG HYDROXIDE-SIMETH 200-200-20 MG/5ML PO SUSP: 30.0000 mL | Freq: Four times a day (QID) | ORAL | Status: DC | PRN

## 2022-01-11 NOTE — ED Triage Notes (Signed)
Pt presents to the ED with cousin and aunt. Cousin states that pt's roommates called them tonight stating that she states that she felt like she was going to die and threw up. Pt fell asleep on the way here. During triage pt denies thoughts of hurting herself or anybody else. States that she hasn't been able to sleep in two weeks. A&Ox4 at time of triage. Pt tearful at time of triage

## 2022-01-11 NOTE — ED Notes (Signed)
Dc instructions reviewed with patient. Patient voiced understanding. Dc with belongings.  °

## 2022-01-11 NOTE — BH Assessment (Addendum)
Comprehensive Clinical Assessment (CCA) Note  01/11/2022 Amy Petersen [id number] Disposition: Clinician discussed patient care with Evette Georges, NP.  He recommended that patient be observed and seen by psychiatry on 09/23.  He said patient is on some birth control that can cause depression.  Patient disposition given to Dr. Roxanne Mins by telephone and RN [facility] via secure messaging.  Pt is tearful at times during assessment.  It is doubtful she would follow through with suggestions of seeing counselor with student counseling center on campus.  Patient is oriented and has good eye contact.  She is from [place] but does not need interpreter.  Pt is not responding to internal stimuli.  She does not appear to be delusional at this time.  Pt statements are clear and coherent.  Reports poor sleep this past week.  Appetite is up and down.  Pt has no outpatient care.   Chief Complaint:  Chief Complaint  Patient presents with   Insomnia   Visit Diagnosis: MDD single episode moderate    CCA Screening, Triage and Referral (STR)  Patient Reported Information How did you hear about [place]? Family/Friend  What Is the Reason for Your Visit/Call Today? Pt was brought to [facility] by her aunt and cousin.  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  During this past week she has had a hard time getting to sleep but having to get up at the usual time.  Patient says she has been under more stress lately.  She cannot identify what is causing this feeling.  She has had some depression.  Pt says he appetite "comes and goes."  She denies any SI or previous attempts.  Patient denies any HI or A/V hallucinations.  Pt denies any use of ETOH or THC.  She has cried three times this week.  Mother passed away when she was 40 years old.  How Long Has This Been Causing You Problems? <Week  What Do You Feel Would Help You the Most Today? Treatment for Depression or other mood  problem   Have You Recently Had Any Thoughts About Hurting Yourself? No  Are You Planning to Commit Suicide/Harm Yourself At This time? No   Have you Recently Had Thoughts About [place]? No  Are You Planning to Harm Someone at This Time? No  Explanation: No data recorded  Have You Used Any Alcohol or Drugs in the Past 24 Hours? No  How Long Ago Did You Use Drugs or Alcohol? No data recorded What Did You Use and How Much? No data recorded  Do You Currently Have a Therapist/Psychiatrist? No  Name of Therapist/Psychiatrist: No data recorded  Have You Been Recently Discharged From Any Office Practice or Programs? No  Explanation of Discharge From Practice/Program: No data recorded    CCA Screening Triage Referral Assessment Type of Contact: Tele-Assessment  Telemedicine Service Delivery:   Is this Initial or Reassessment? Initial Assessment  Date Telepsych consult ordered in [facility]:  01/11/22  Time Telepsych consult ordered in [facility]:  0407  Location of Assessment: Other (comment) ([facility])  Provider Location: [facility] Assessment Services   Collateral Involvement: No data recorded  Does Patient Have a [place]? No  Legal Guardian Contact Information: No data recorded Copy of Legal Guardianship Form: No data recorded Legal Guardian Notified of Arrival: No data recorded Legal Guardian Notified of Pending Discharge: No data recorded If Minor and Not Living with Parent(s), Who has Custody? No data  recorded Is CPS involved or ever been involved? No data recorded Is APS involved or ever been involved? No data recorded  Patient Determined To Be At Risk for Harm To Self or Others Based on Review of Patient Reported Information or Presenting Complaint? No  Method: No data recorded Availability of Means: No data recorded Intent: No data recorded Notification Required: No data recorded Additional Information for Danger to Others Potential:  No data recorded Additional Comments for Danger to Others Potential: No data recorded Are There Guns or Other Weapons in Your Home? No data recorded Types of Guns/Weapons: No data recorded Are These Weapons Safely Secured?                            No data recorded Who Could Verify You Are Able To Have These Secured: No data recorded Do You Have any Outstanding Charges, Pending Court Dates, Parole/Probation? No data recorded Contacted To Inform of Risk of Harm To Self or Others: No data recorded   Does Patient Present under Involuntary Commitment? No  IVC Papers Initial File Date: No data recorded  [place] of Residence: [place]   Patient Currently Receiving the Following Services: Not Receiving Services   Determination of Need: Urgent (48 hours)   Options For Referral: Other: Comment (Pt to be seen by psychiatry on 09/23)     CCA Biopsychosocial Patient Reported Schizophrenia/Schizoaffective Diagnosis in Past: No   Strengths: Pt makes good grades in college and has supportive family.   Mental Health Symptoms Depression:   Hopelessness; Fatigue; Difficulty Concentrating; Change in energy/activity; Sleep (too much or little); Tearfulness   Duration of Depressive symptoms:  Duration of Depressive Symptoms: Less than two weeks   Mania:   None   Anxiety:    Difficulty concentrating; Restlessness; Sleep; Tension; Worrying   Psychosis:   None   Duration of Psychotic symptoms:    Trauma:   Avoids reminders of event; Difficulty staying/falling asleep   Obsessions:   None   Compulsions:   None   Inattention:   None   Hyperactivity/Impulsivity:   None   Oppositional/Defiant Behaviors:   None   Emotional Irregularity:  No data recorded  Other Mood/Personality Symptoms:  No data recorded   Mental Status Exam Appearance and self-care  Stature:   Average   Weight:   Average weight   Clothing:   Casual   Grooming:   Normal   Cosmetic use:   Age  appropriate   Posture/gait:  No data recorded  Motor activity:   Not Remarkable   Sensorium  Attention:   Normal   Concentration:   Anxiety interferes   Orientation:   X5   Recall/memory:   Normal   Affect and Mood  Affect:   Anxious; Tearful; Depressed   Mood:   Depressed   Relating  Eye contact:   Normal   Facial expression:   Depressed; Anxious   Attitude toward examiner:   Cooperative; Guarded   Thought and Language  Speech flow:  Clear and Coherent (Pt is from [place].  Has accent.)   Thought content:   Appropriate to Mood and Circumstances   Preoccupation:   Ruminations   Hallucinations:   None   Organization:  No data recorded  [employer] of Knowledge:   Good   Intelligence:   Above Average   Abstraction:   Normal   Judgement:   Impaired   Reality Testing:   Adequate; Realistic  Insight:   Fair   Decision Making:   Confused   Social Functioning  Social Maturity:   Isolates   Social Judgement:   Normal   Stress  Stressors:   Grief/losses; School   Coping Ability:   Overwhelmed   Skill Deficits:   None   Supports:   Family; Friends/Service system     Religion:    Leisure/Recreation:    Exercise/Diet: Exercise/Diet Have You Gained or Lost A Significant Amount of Weight in the Past Six Months?: No (Has been skipping meals over the last week.) Do You Follow a Special Diet?: No Do You Have Any Trouble Sleeping?: Yes Explanation of Sleeping Difficulties: Hard to get to sleep.   CCA Employment/Education Employment/Work Situation: Employment / Work Situation Employment Situation: Radio broadcast assistant Job has Been Impacted by Current Illness: No Has Patient ever Been in the [employer]?: No  Education: Education Is Patient Currently Attending School?: Yes School Currently Attending: Engineer, technical sales Did You Nutritional therapist?: Yes What Type of College Degree Do you Have?: currently enrolled at  [employer] Family/Childhood History Family and Relationship History: Family history Marital status: Single Does patient have children?: No  Childhood History:  Childhood History By whom was/is the patient raised?: Mother (Other relatives helped raise when mother died when pt was 64 years old.) Did patient suffer any verbal/emotional/physical/sexual abuse as a child?: No Did patient suffer from severe childhood neglect?: No Has patient ever been sexually abused/assaulted/raped as an adolescent or adult?: No Was the patient ever a victim of a crime or a disaster?: No Witnessed domestic violence?: No (Unknown)  Child/Adolescent Assessment:     CCA Substance Use Alcohol/Drug Use: Alcohol / Drug Use Pain Medications: None Prescriptions: See MAR Over the Counter: See MAR History of alcohol / drug use?: No history of alcohol / drug abuse                         ASAM's:  Six Dimensions of Multidimensional Assessment  Dimension 1:  Acute Intoxication and/or Withdrawal Potential:      Dimension 2:  Biomedical Conditions and Complications:      Dimension 3:  Emotional, Behavioral, or Cognitive Conditions and Complications:     Dimension 4:  Readiness to Change:     Dimension 5:  Relapse, Continued use, or Continued Problem Potential:     Dimension 6:  Recovery/Living Environment:     ASAM Severity Score:    ASAM Recommended Level of Treatment:     Substance use Disorder (SUD)    Recommendations for Services/Supports/Treatments:    Discharge Disposition:    DSM5 Diagnoses: Patient Active Problem List   Diagnosis Date Noted   Cyst of right ovary 11/12/2021   Birth control counseling 02/22/2021   Dermatography 08/30/2020   Shortness of breath 08/30/2020   Mild acne 02/01/2019   Refugee health examination 01/31/2019     Referrals to Alternative Service(s): Referred to Alternative Service(s):   Place:   Date:   Time:    Referred to Alternative  Service(s):   Place:   Date:   Time:    Referred to Alternative Service(s):   Place:   Date:   Time:    Referred to Alternative Service(s):   Place:   Date:   Time:     [place] Session

## 2022-01-11 NOTE — ED Notes (Signed)
TTS in progress 

## 2022-01-11 NOTE — Discharge Instructions (Addendum)
Please follow-up with the counseling services on campus.  If you develop any thoughts of hurting yourself or start hearing voices telling you to hurt herself, return immediately for reevaluation.

## 2022-01-11 NOTE — ED Provider Notes (Addendum)
[facility] EMERGENCY DEPT Provider Note   CSN: [id number] Arrival date & time: 01/11/22  4431     History  Chief Complaint  Patient presents with   Insomnia    Amy Petersen is a 21 y.o. female.  The history is provided by the patient.  Insomnia  She has history of malaria and comes in because she is very stressed and has been having difficulty sleeping.  She states that she is stressed over school and life in general.  She lays in bed for hours before finally falling asleep but she denies early morning wakening.  She has not had any crying spells and she denies anhedonia.  She does admit to depression.  She denies active suicidal thoughts but states that she would not mind if she were dead.  She denies alcohol and drug use.  She denies hallucinations.  She does endorse anorexia.  She denies's homicidal ideation.  She has not received any kind of counseling.   Home Medications Prior to Admission medications   Medication Sig Start Date End Date Taking? Authorizing Provider  albuterol (VENTOLIN HFA) 108 (90 Base) MCG/ACT inhaler Inhale 2 puffs into the lungs every 6 (six) hours as needed for wheezing or shortness of breath. 02/22/21   Sharion Settler, DO  budesonide-formoterol (SYMBICORT) 160-4.5 MCG/ACT inhaler Inhale 2 puffs into the lungs in the morning and at bedtime. Patient not taking: Reported on 11/02/2021 09/12/21   Maryjane Hurter, MD  Ibuprofen 200 MG CAPS Take 1 capsule by mouth daily as needed (for pain).    [provider]  Spacer/Aero-Holding Chambers (AEROCHAMBER MV) inhaler Use as instructed 09/13/21   Maryjane Hurter, MD  Spacer/Aero-Holding Dorise Bullion Use as directed 09/12/21   Maryjane Hurter, MD      Allergies    Patient has no known allergies.    Review of Systems   Review of Systems  Psychiatric/Behavioral:  The patient has insomnia.   All other systems reviewed and are negative.   Physical Exam Updated Vital  Signs BP (!) 150/97 (BP Location: Right Arm)   Pulse (!) 117   Temp 98.5 F (36.9 C) (Oral)   Resp 18   Ht 5\' 3"  (1.6 m)   Wt 62.1 kg   SpO2 100%   BMI 24.25 kg/m  Physical Exam Vitals and nursing note reviewed.   21 year old female, resting comfortably and in no acute distress. Vital signs are significant for elevated heart rate and blood pressure. Oxygen saturation is 100%, which is normal. Head is normocephalic and atraumatic. PERRLA, EOMI. Oropharynx is clear. Neck is nontender and supple without adenopathy or JVD. Back is nontender and there is no CVA tenderness. Lungs are clear without rales, wheezes, or rhonchi. Chest is nontender. Heart has regular rate and rhythm without murmur. Abdomen is soft, flat, nontender Extremities have no cyanosis or edema, full range of motion is present. Skin is warm and dry without rash. Neurologic: Awake and alert but with a somewhat depressed affect.  She makes poor eye contact and speaks in a soft voice which is monotone.  Cranial nerves are intact, moves all extremities equally.  ED Results / Procedures / Treatments   Labs (all labs ordered are listed, but only abnormal results are displayed) Labs Reviewed  COMPREHENSIVE METABOLIC PANEL - Abnormal; Notable for the following components:      Result Value   Glucose, Bld 121 (*)    Alkaline Phosphatase 37 (*)    Total Bilirubin 0.2 (*)  All other components within normal limits  ETHANOL - Abnormal; Notable for the following components:   Alcohol, Ethyl (B) 79 (*)    All other components within normal limits  SALICYLATE LEVEL - Abnormal; Notable for the following components:   Salicylate Lvl [id number] (*)    All other components within normal limits  ACETAMINOPHEN LEVEL - Abnormal; Notable for the following components:   Acetaminophen (Tylenol), Serum <10 (*)    All other components within normal limits  RESP PANEL BY RT-PCR (FLU A&B, COVID) ARPGX2  CBC WITH DIFFERENTIAL/PLATELET   RAPID URINE DRUG SCREEN, HOSP PERFORMED  PREGNANCY, URINE  URINALYSIS, ROUTINE W REFLEX MICROSCOPIC   Procedures Procedures  Cardiac monitor shows sinus tachycardia, per my interpretation.  Medications Ordered in ED Medications - No data to display  ED Course/ Medical Decision Making/ A&P                           Medical Decision Making Amount and/or Complexity of Data Reviewed Labs: ordered.  Risk OTC drugs. Prescription drug management.   Major depression without psychotic features.  I suspect her tachycardia is related to dehydration due to decreased oral intake.  I have ordered IV fluids and screening labs of CBC, comprehensive metabolic panel, ethanol level, salicylate level, acetaminophen level, urine drug screen, pregnancy test.  I have reviewed and interpreted her laboratory tests, my interpretation is mild elevation of random glucose, normal CBC, normal urinalysis, negative urine drug screen, alcohol in bloodstream but below the level of legal intoxication.  TTS consultation is appreciated, recommendation is for her to be kept in the emergency department for evaluation by psychiatrist.  Final Clinical Impression(s) / ED Diagnoses Final diagnoses:  Major depressive disorder, single episode, severe ([place])  Insomnia, unspecified type    Rx / DC Orders ED Discharge Orders     None         Delora Fuel, MD [phone]  Patient states that she does not want to wait for psychiatry evaluation.  She agrees to contract for safety.  Since she is not suicidal, she is not a candidate for involuntary commitment and she is discharged with contract for safety.   Delora Fuel, MD [phone]

## 2022-01-11 NOTE — ED Notes (Signed)
Family sitting at bedside with pt. Pt not suicidal or homicidal. Per MD pt appears to be suffering from anxiety and depression and order was placed for TTS consult to provide further outpatient resources.

## 2022-01-13 ENCOUNTER — Telehealth: Payer: Self-pay

## 2022-01-13 NOTE — Telephone Encounter (Signed)
Transition Care Management Unsuccessful Follow-up Telephone Call  Date of discharge and from where:  01/11/2022 from [facility]  Attempts:  1st Attempt  Reason for unsuccessful TCM follow-up call:  Left voice message

## 2022-01-16 NOTE — Telephone Encounter (Signed)
Transition Care Management Follow-up Telephone Call Date of discharge and from where: 01/11/2022 from [facility] How have you been since you were released from the hospital? Patient stated that she feeling much better than she was yesterday. Patient did not have any questions or concerns.  Any questions or concerns? No  Items Reviewed: Did the pt receive and understand the discharge instructions provided? Yes  Medications obtained and verified? Yes  Other? No  Any new allergies since your discharge? No  Dietary orders reviewed? No Do you have support at home? Yes   Functional Questionnaire: (I = Independent and D = Dependent) ADLs: I  Bathing/Dressing- I  Meal Prep- I  Eating- I  Maintaining continence- I  Transferring/Ambulation- I  Managing Meds- I   Follow up appointments reviewed:  PCP Hospital f/u appt confirmed? No  Patient stated that PCP reached out to schedule a follow up.  [facility] f/u appt confirmed? No   Are transportation arrangements needed? No  If their condition worsens, is the pt aware to call PCP or go to the Emergency Dept.? Yes Was the patient provided with contact information for the PCP's office or ED? Yes Was to pt encouraged to call back with questions or concerns? Yes

## 2022-02-11 DIAGNOSIS — Z118 Encounter for screening for other infectious and parasitic diseases: Secondary | ICD-10-CM | POA: Diagnosis not present

## 2022-02-11 DIAGNOSIS — N390 Urinary tract infection, site not specified: Secondary | ICD-10-CM | POA: Diagnosis not present

## 2022-02-11 DIAGNOSIS — Z113 Encounter for screening for infections with a predominantly sexual mode of transmission: Secondary | ICD-10-CM | POA: Diagnosis not present

## 2022-04-06 ENCOUNTER — Encounter (HOSPITAL_BASED_OUTPATIENT_CLINIC_OR_DEPARTMENT_OTHER): Payer: Self-pay | Admitting: Emergency Medicine

## 2022-04-06 ENCOUNTER — Emergency Department (HOSPITAL_BASED_OUTPATIENT_CLINIC_OR_DEPARTMENT_OTHER): Payer: Medicaid Other | Admitting: Radiology

## 2022-04-06 ENCOUNTER — Emergency Department (HOSPITAL_BASED_OUTPATIENT_CLINIC_OR_DEPARTMENT_OTHER)
Admission: EM | Admit: 2022-04-06 | Discharge: 2022-04-06 | Disposition: A | Discharge status: Home / Self Care | Payer: Medicaid Other | Attending: Emergency Medicine | Admitting: Emergency Medicine

## 2022-04-06 ENCOUNTER — Other Ambulatory Visit: Payer: Self-pay

## 2022-04-06 DIAGNOSIS — R519 Headache, unspecified: Secondary | ICD-10-CM | POA: Diagnosis not present

## 2022-04-06 DIAGNOSIS — Z7951 Long term (current) use of inhaled steroids: Secondary | ICD-10-CM | POA: Diagnosis not present

## 2022-04-06 DIAGNOSIS — Z7952 Long term (current) use of systemic steroids: Secondary | ICD-10-CM | POA: Diagnosis not present

## 2022-04-06 DIAGNOSIS — R059 Cough, unspecified: Secondary | ICD-10-CM | POA: Diagnosis not present

## 2022-04-06 DIAGNOSIS — Z20822 Contact with and (suspected) exposure to covid-19: Secondary | ICD-10-CM | POA: Insufficient documentation

## 2022-04-06 DIAGNOSIS — R0602 Shortness of breath: Secondary | ICD-10-CM | POA: Diagnosis present

## 2022-04-06 DIAGNOSIS — J4541 Moderate persistent asthma with (acute) exacerbation: Secondary | ICD-10-CM

## 2022-04-06 LAB — RESP PANEL BY RT-PCR (RSV, FLU A&B, COVID)  RVPGX2
Influenza A by PCR: NEGATIVE
Influenza B by PCR: NEGATIVE
Resp Syncytial Virus by PCR: NEGATIVE
SARS Coronavirus 2 by RT PCR: NEGATIVE

## 2022-04-06 MED ORDER — PREDNISONE 50 MG PO TABS
60.0000 mg | ORAL_TABLET | Freq: Once | ORAL | Status: AC
  Administered 2022-04-06: 60 mg via ORAL
  Filled 2022-04-06: qty 1

## 2022-04-06 MED ORDER — IPRATROPIUM-ALBUTEROL 0.5-2.5 (3) MG/3ML IN SOLN
3.0000 mL | Freq: Once | RESPIRATORY_TRACT | Status: AC
  Administered 2022-04-06: 3 mL via RESPIRATORY_TRACT
  Filled 2022-04-06: qty 3

## 2022-04-06 MED ORDER — BUDESONIDE-FORMOTEROL FUMARATE 160-4.5 MCG/ACT IN AERO: 2.0000 | INHALATION_SPRAY | Freq: Two times a day (BID) | RESPIRATORY_TRACT | 1 refills | Status: DC

## 2022-04-06 MED ORDER — PREDNISONE 20 MG PO TABS: ORAL_TABLET | ORAL | 0 refills | Status: DC

## 2022-04-06 MED ORDER — ALBUTEROL SULFATE (2.5 MG/3ML) 0.083% IN NEBU
2.5000 mg | INHALATION_SOLUTION | Freq: Once | RESPIRATORY_TRACT | Status: AC
  Administered 2022-04-06: 2.5 mg via RESPIRATORY_TRACT
  Filled 2022-04-06: qty 3

## 2022-04-06 MED ORDER — AEROCHAMBER PLUS FLO-VU MEDIUM MISC
1.0000 | Freq: Once | Status: AC
  Administered 2022-04-06: 1
  Filled 2022-04-06: qty 1

## 2022-04-06 MED ORDER — ALBUTEROL SULFATE HFA 108 (90 BASE) MCG/ACT IN AERS
2.0000 | INHALATION_SPRAY | RESPIRATORY_TRACT | Status: DC | PRN
  Administered 2022-04-06: 2 via RESPIRATORY_TRACT
  Filled 2022-04-06: qty 6.7

## 2022-04-06 NOTE — ED Provider Notes (Signed)
[facility] EMERGENCY DEPT Provider Note   CSN: [id number] Arrival date & time: 04/06/22  6734     History  Chief Complaint  Patient presents with   Shortness of Breath    Amy Petersen is a 21 y.o. female.  Patient is a 21 year old female who presents with an asthma exacerbation.  She has a history of asthma.  She says she has had shortness of breath and wheezing for the last few days.  She has had a dry cough.  No fevers.  No runny nose or nasal congestion.  No leg swelling or calf tenderness.  She says she uses her inhaler pretty much weekly.  She used to be on Symbicort but she did not get it refilled.       Home Medications Prior to Admission medications   Medication Sig Start Date End Date Taking? Authorizing Provider  predniSONE (DELTASONE) 20 MG tablet 2 tabs po daily x 4 days 04/06/22  Yes Rolan Bucco, MD  albuterol (VENTOLIN HFA) 108 (90 Base) MCG/ACT inhaler Inhale 2 puffs into the lungs every 6 (six) hours as needed for wheezing or shortness of breath. 02/22/21   Sabino Dick, DO  budesonide-formoterol (SYMBICORT) 160-4.5 MCG/ACT inhaler Inhale 2 puffs into the lungs in the morning and at bedtime. 04/06/22   Rolan Bucco, MD  Ibuprofen 200 MG CAPS Take 1 capsule by mouth daily as needed (for pain).    [provider]  Spacer/Aero-Holding Chambers (AEROCHAMBER MV) inhaler Use as instructed 09/13/21   Omar Person, MD  Spacer/Aero-Holding Rudean Curt Use as directed 09/12/21   Omar Person, MD      Allergies    Patient has no known allergies.    Review of Systems   Review of Systems  Constitutional:  Negative for chills, diaphoresis, fatigue and fever.  HENT:  Negative for congestion, rhinorrhea and sneezing.   Eyes: Negative.   Respiratory:  Positive for cough, shortness of breath and wheezing. Negative for chest tightness.   Cardiovascular:  Negative for chest pain and leg swelling.  Gastrointestinal:  Negative  for abdominal pain, blood in stool, diarrhea, nausea and vomiting.  Genitourinary:  Negative for difficulty urinating, flank pain, frequency and hematuria.  Musculoskeletal:  Negative for arthralgias and back pain.  Skin:  Negative for rash.  Neurological:  Negative for dizziness, speech difficulty, weakness, numbness and headaches.    Physical Exam Updated Vital Signs BP 112/75 (BP Location: Right Arm)   Pulse 85   Temp 98.9 F (37.2 C) (Oral)   Resp 18   Ht 5\' 3"  (1.6 m)   Wt 63 kg   SpO2 98%   BMI 24.60 kg/m  Physical Exam Constitutional:      Appearance: She is well-developed.  HENT:     Head: Normocephalic and atraumatic.  Eyes:     Pupils: Pupils are equal, round, and reactive to light.  Cardiovascular:     Rate and Rhythm: Normal rate and regular rhythm.     Heart sounds: Normal heart sounds.  Pulmonary:     Effort: Pulmonary effort is normal. Tachypnea present. No respiratory distress.     Breath sounds: Wheezing present. No rales.  Chest:     Chest wall: No tenderness.  Abdominal:     General: Bowel sounds are normal.     Palpations: Abdomen is soft.     Tenderness: There is no abdominal tenderness. There is no guarding or rebound.  Musculoskeletal:        General: Normal  range of motion.     Cervical back: Normal range of motion and neck supple.  Lymphadenopathy:     Cervical: No cervical adenopathy.  Skin:    General: Skin is warm and dry.     Findings: No rash.  Neurological:     Mental Status: She is alert and oriented to person, place, and time.     ED Results / Procedures / Treatments   Labs (all labs ordered are listed, but only abnormal results are displayed) Labs Reviewed  RESP PANEL BY RT-PCR (RSV, FLU A&B, COVID)  [id number]    EKG None  Radiology DG Chest 2 View  Result Date: 04/06/2022 CLINICAL DATA:  21 year old female with history of shortness of breath. History of asthma. Cough and headache. EXAM: CHEST - 2 VIEW COMPARISON:  Chest  x-ray 05/15/2021. FINDINGS: Lung volumes are normal. No consolidative airspace disease. No pleural effusions. No pneumothorax. No pulmonary nodule or mass noted. Pulmonary vasculature and the cardiomediastinal silhouette are within normal limits. IMPRESSION: No radiographic evidence of acute cardiopulmonary disease. Electronically Signed   By: Trudie Reed M.D.   On: 04/06/2022 05:29    Procedures Procedures    Medications Ordered in ED Medications  albuterol (VENTOLIN HFA) 108 (90 Base) MCG/ACT inhaler 2 puff (2 puffs Inhalation Given 04/06/22 0822)  AeroChamber Plus Flo-Vu Medium MISC 1 each (has no administration in time range)  predniSONE (DELTASONE) tablet 60 mg (has no administration in time range)  ipratropium-albuterol (DUONEB) 0.5-2.5 (3) MG/3ML nebulizer solution 3 mL (3 mLs Nebulization Given 04/06/22 0350)  albuterol (PROVENTIL) (2.5 MG/3ML) 0.083% nebulizer solution 2.5 mg (2.5 mg Nebulization Given 04/06/22 0350)    ED Course/ Medical Decision Making/ A&P                           Medical Decision Making Amount and/or Complexity of Data Reviewed Independent Historian: parent External Data Reviewed: notes. Labs: ordered. Decision-making details documented in ED Course. Radiology: ordered and independent interpretation performed. Decision-making details documented in ED Course.  Risk Prescription drug management. Decision regarding hospitalization.   Patient is a 21 year old female who presents with an asthma exacerbation.  Chest x-ray was performed which was interpreted by me and confirmed by the radiologist to show no evidence of pneumonia or other acute abnormality.  No pneumothorax.  Her COVID/flu/RSV test was negative.  She was given multiple nebulizer treatments in the ED.  She was given dose of prednisone.  Her symptoms gradually improved and on reexam, she is back to baseline.  She has no increased work of breathing.  Only scarce wheezing on exam.  No hypoxia.   At this point I do not feel that she needs inpatient hospitalization.  Will start her on a prednisone burst.  Will start her back on her Symbicort.  Encouraged her to have close follow-up with her primary care doctor.  Return precautions were given.  Final Clinical Impression(s) / ED Diagnoses Final diagnoses:  Moderate persistent asthma with exacerbation    Rx / DC Orders ED Discharge Orders          Ordered    budesonide-formoterol (SYMBICORT) 160-4.5 MCG/ACT inhaler  2 times daily        04/06/22 0911    predniSONE (DELTASONE) 20 MG tablet        04/06/22 0911              Rolan Bucco, MD 04/06/22 [phone]

## 2022-04-06 NOTE — ED Notes (Signed)
RT note: Pt. pulled back to rm. 4 from waiting area from being seen earlier this date, assessed and given Albuterol Inh. after  pulled from [facility] at this time with spacer given, both charged previously, charted with v/s updated, charge RN aware, awaiting d/c paperwork.

## 2022-04-06 NOTE — ED Notes (Signed)
Discharge paperwork given and verbally understood. 

## 2022-04-06 NOTE — Discharge Instructions (Signed)
Take the prednisone once a day for the next 4 days.  You can start it tomorrow as you got a dose here for today.  Start back on the Symbicort for better management of your asthma.  Follow-up with your primary care doctor for recheck.  Return to the emergency room if you have any worsening symptoms.

## 2022-04-06 NOTE — ED Triage Notes (Signed)
Pt c/o shob from asthma exacerbation x 2 days. Pt states she has been using her inhaler without much relief. Pt has audible wheezing. Pt c/o cough and headache as well.

## 2022-04-06 NOTE — ED Notes (Signed)
RT educated pt on proper use of MDI w/spacer. Pt also educated on the importance of taking her maintenance inhaler as prescribed daily in order for it to be effective.Pt is a current pt of pulmonology [facility].  Pt verbalizes understanding of education.

## 2022-05-09 ENCOUNTER — Encounter: Payer: Self-pay | Admitting: Family Medicine

## 2022-05-09 ENCOUNTER — Ambulatory Visit: Payer: Medicaid Other | Admitting: Family Medicine

## 2022-05-09 VITALS — BP 100/70 | HR 69 | Ht 63.0 in | Wt 138.4 lb

## 2022-05-09 DIAGNOSIS — Z3046 Encounter for surveillance of implantable subdermal contraceptive: Secondary | ICD-10-CM | POA: Insufficient documentation

## 2022-05-09 NOTE — Progress Notes (Signed)
   SUBJECTIVE:   CHIEF COMPLAINT / HPI:   Nexplanon removal desired Desires removal of her Nexplanon, has been in for about one year Wants out because she has been experiencing depression symptoms No period in four months, so unsure of LMP Previously tried the depo shot, but did not like it because when she stopped, she had very heavy periods  OCPs caused nausea and sickness Does not desire alternative contraception; wants a break from nexplanon and any form of birth control   PERTINENT  PMH / PSH:  Patient Active Problem List   Diagnosis Date Noted   Nexplanon removal 05/09/2022   Cyst of right ovary 11/12/2021   Birth control counseling 02/22/2021   Dermatography 08/30/2020   Shortness of breath 08/30/2020   Mild acne 02/01/2019   Refugee health examination 01/31/2019    OBJECTIVE:   BP 100/70   Pulse 69   Ht 5\' 3"  (1.6 m)   Wt 138 lb 6.4 oz (62.8 kg)   SpO2 99%   BMI 24.52 kg/m    PHQ-9:     05/09/2022   10:14 AM 11/12/2021   10:14 AM 05/23/2021    9:17 AM  Depression screen PHQ 2/9  Decreased Interest 2 0 0  Down, Depressed, Hopeless 0 1 0  PHQ - 2 Score 2 1 0  Altered sleeping 0 1 0  Tired, decreased energy 0 1 0  Change in appetite 1 0 0  Feeling bad or failure about yourself  0 0 0  Trouble concentrating 0 0 0  Moving slowly or fidgety/restless 0 0 0  Suicidal thoughts 0 0 0  PHQ-9 Score 3 3 0  Difficult doing work/chores  Somewhat difficult Not difficult at all    Physical Exam General: Awake, alert, oriented, no acute distress Respiratory: Unlabored respirations, speaking in full sentences, no respiratory distress Extremities: Moving all extremities spontaneously Neuro: Cranial nerves II through X grossly intact Psych: Normal insight and judgement   PROCEDURE:   Risks & benefits of Nexplanon removal discussed. Consent form signed.  The patient denies any allergies to anesthetics or antiseptics.  Procedure: Pt was placed in supine  position. Left arm was flexed at the elbow and externally rotated so that her wrist was parallel to her ear, The device was palpated and marked. The site was cleaned with Betadine. The area surrounding the device was covered with a sterile drape. 1% lidocaine with epi was injected just under the device. A scalpel was used to create a small incision. The device was pushed towards the incision. Fibrous tissue surrounding the device was gradually removed from the device. The device was removed and measured to ensure all 4 cm of device was removed. Steri-strips were used to close the incision. Pressure dressing was applied to the patient.  The patient was instructed to removed the pressure dressing in 24 hrs.  The patient was advised to move slowly from a supine to an upright position  The patient denied any concerns or complaints  ASSESSMENT/PLAN:   Nexplanon removal Nexplanon removed today after counseling. Patient politely declines alternative birth control. Discussed her impending fertility now that [place] has been removed. Patient expresses understanding. After care instructions given. See AVS for more.      Ezequiel Essex, MD [place]

## 2022-05-09 NOTE — Patient Instructions (Addendum)
Women may have discomfort and some bruising following the removal of NEXPLANON.  Similar to when you get NEXPLANON, you will want to wear your pressure bandage for a full 24 hours, then an adhesive bandage for 3-5 days.  You may become pregnant as early as a week after the removal of NEXPLANON.  After NEXPLANON is removed, and if you do not wish to get pregnant at this time, you should start another birth control method, such as condoms, right away.  If you want to get pregnant, please consult with your doctor.  Please let us know if you develop pain, redness, swelling, heat, or other problems with your arm at the site of removal.  Ezequiel Essex, MD

## 2022-05-12 NOTE — Assessment & Plan Note (Signed)
Nexplanon removed today after counseling. Patient politely declines alternative birth control. Discussed her impending fertility now that [place] has been removed. Patient expresses understanding. After care instructions given. See AVS for more.

## 2022-05-30 DIAGNOSIS — J3489 Other specified disorders of nose and nasal sinuses: Secondary | ICD-10-CM | POA: Diagnosis not present

## 2022-05-30 DIAGNOSIS — B349 Viral infection, unspecified: Secondary | ICD-10-CM | POA: Diagnosis not present

## 2022-05-30 DIAGNOSIS — R6883 Chills (without fever): Secondary | ICD-10-CM | POA: Diagnosis not present

## 2022-05-30 DIAGNOSIS — R42 Dizziness and giddiness: Secondary | ICD-10-CM | POA: Diagnosis not present

## 2022-05-30 DIAGNOSIS — R5383 Other fatigue: Secondary | ICD-10-CM | POA: Diagnosis not present

## 2022-05-30 DIAGNOSIS — Z20822 Contact with and (suspected) exposure to covid-19: Secondary | ICD-10-CM | POA: Diagnosis not present

## 2022-05-30 DIAGNOSIS — R059 Cough, unspecified: Secondary | ICD-10-CM | POA: Diagnosis not present

## 2022-05-30 DIAGNOSIS — R110 Nausea: Secondary | ICD-10-CM | POA: Diagnosis not present

## 2022-06-18 ENCOUNTER — Encounter: Payer: Self-pay | Admitting: Family Medicine

## 2022-06-23 ENCOUNTER — Ambulatory Visit: Payer: Medicaid Other | Admitting: Family Medicine

## 2022-06-23 ENCOUNTER — Encounter: Payer: Self-pay | Admitting: Family Medicine

## 2022-06-23 ENCOUNTER — Other Ambulatory Visit (HOSPITAL_COMMUNITY)
Admission: RE | Admit: 2022-06-23 | Discharge: 2022-06-23 | Disposition: A | Discharge status: Home / Self Care | Payer: Medicaid Other | Source: Ambulatory Visit | Attending: Family Medicine | Admitting: Family Medicine

## 2022-06-23 VITALS — BP 102/78 | HR 74 | Ht 63.0 in | Wt 138.0 lb

## 2022-06-23 DIAGNOSIS — A64 Unspecified sexually transmitted disease: Secondary | ICD-10-CM | POA: Insufficient documentation

## 2022-06-23 DIAGNOSIS — Z7251 High risk heterosexual behavior: Secondary | ICD-10-CM | POA: Diagnosis not present

## 2022-06-23 DIAGNOSIS — B9689 Other specified bacterial agents as the cause of diseases classified elsewhere: Secondary | ICD-10-CM | POA: Diagnosis not present

## 2022-06-23 DIAGNOSIS — Z789 Other specified health status: Secondary | ICD-10-CM | POA: Diagnosis not present

## 2022-06-23 DIAGNOSIS — Z124 Encounter for screening for malignant neoplasm of cervix: Secondary | ICD-10-CM

## 2022-06-23 DIAGNOSIS — R102 Pelvic and perineal pain: Secondary | ICD-10-CM | POA: Diagnosis not present

## 2022-06-23 DIAGNOSIS — N760 Acute vaginitis: Secondary | ICD-10-CM

## 2022-06-23 LAB — POCT WET PREP (WET MOUNT)
Clue Cells Wet Prep Whiff POC: POSITIVE
Trichomonas Wet Prep HPF POC: ABSENT

## 2022-06-23 LAB — POCT URINE PREGNANCY: Preg Test, Ur: NEGATIVE

## 2022-06-23 MED ORDER — METRONIDAZOLE 500 MG PO TABS: 500.0000 mg | ORAL_TABLET | Freq: Two times a day (BID) | ORAL | 0 refills | Status: AC

## 2022-06-23 NOTE — Progress Notes (Signed)
    SUBJECTIVE:   CHIEF COMPLAINT / HPI:   Amy Petersen is a 22 y.o. female who presents to the [facility] clinic today to discuss the following concerns:   Pelvic Pain, Vaginal Discharge Reports intermittent cramping- was so severe at one point that she went to the Emergency Department for this. Was seen on 2/9, dx of viral syndrome and was rx Zofran. She reports that she thought she had a urinary infection but was not sent in abx.   She had cramping nearly every day last week. Lasting 1-2 hours. Did not take medications for it. No N/V but does have some "light headaches". She reports   LMP was sometime last year, she states her period stopped being regular. She recently had Nexplanon removed at the end of January- has not yet had a period.   Sexually active with one female partner, uses condoms intermittently.   PERTINENT  PMH / PSH: 3.8 cm right ovarian cyst with simple fluid density seen in 10/2021  OBJECTIVE:   BP 102/78   Pulse 74   Ht '5\' 3"'$  (1.6 m)   Wt 138 lb (62.6 kg)   LMP  (LMP Unknown)   SpO2 99%   BMI 24.45 kg/m    General: NAD, pleasant, able to participate in exam Respiratory: normal effort Abdomen: Bowel sounds present, mildly tender to central abdomen without R/G, soft, nondistended GU: Normal appearance of labia majora and minora, without lesions. Vagina tissue pink, moist, without lesions or abrasions. Copious amount of white frothy discharge at vaginal vault. Cervix normal appearance, midlly friable, with discharge from os.  Psych: Normal affect and mood  GU exam chaperoned by Erskine Emery, CMA  ASSESSMENT/PLAN:   1. STI (sexually transmitted infection) Checking for all STI. She did test positive for BV, flagyl sent to pharamcy.  - Cytology - PAP([place]) - POCT Wet Prep [facility]) - HIV antibody (with reflex) - RPR - Hepatitis B surface antigen  2. Pap smear for cervical cancer screening - Cytology - PAP([place])  3. Date of last menstrual  period (LMP) unknown Pregnancy test negative. Irregular since Nexplanon removal.  - POCT urine pregnancy  4. Unprotected sexual intercourse Encouraged consistent use of condoms as patient does not desire pregnancy at this time.  Recommended starting prenatal vitamins as she is high risk for pregnancy. She does not desire contraception at this time. Negative pregnancy test today. - POCT urine pregnancy  5. Bacterial vaginosis - metroNIDAZOLE (FLAGYL) 500 MG tablet; Take 1 tablet (500 mg total) by mouth 2 (two) times daily for 7 days.  Dispense: 14 tablet; Refill: 0  6. Pelvic cramping Recommend NSAID and heating pad. Return if no improvement or worsening symptoms. Reviewed previous CT scan which showed simple cyst, no f/u imaging is recommended.    Sharion Settler, [place]

## 2022-06-23 NOTE — Patient Instructions (Addendum)
It was wonderful to see you today.  Please bring ALL of your medications with you to every visit.   Today we talked about:  -We did your Pap smear today. I will let you know the results. If normal, you will need a repeat in 3 years.  -We are checking for sexually transmitted infections including chlamydia, gonorrhea, trichomonas, HIV, syphilis and hepatitis B. I will let you know of the results via MyChart or telephone call. We are also checking for bacterial vaginosis and yeast, which are not sexually transmitted infections.  -You should abstain from sexual activity until we have the results. If your test is positive for a sexually transmitted infection, it is important that both you and your partner are both treated.  -It is always important to use barrier protection, such as condoms, to help prevent sexually transmitted infections.    I would recommend taking prenatal vitamins since you are at risk for pregnancy We would be happy to discuss other forms of contraception when you are ready.   Thank you for coming to your visit as scheduled. We have had a large "no-show" problem lately, and this significantly limits our ability to see and care for patients. As a friendly reminder- if you cannot make your appointment please call to cancel. We do have a no show policy for those who do not cancel within 24 hours. Our policy is that if you miss or fail to cancel an appointment within 24 hours, 3 times in a 53-monthperiod, you may be dismissed from our clinic.   Thank you for choosing [facility]   Please call [phone] any questions about today's appointment.  Please be sure to schedule follow up at the front  desk before you leave today.   ASharion Settler DO PGY-3 Family Medicine

## 2022-06-24 LAB — RPR: RPR Ser Ql: NONREACTIVE

## 2022-06-24 LAB — HIV ANTIBODY (ROUTINE TESTING W REFLEX): HIV Screen 4th Generation wRfx: NONREACTIVE

## 2022-06-24 LAB — HEPATITIS B SURFACE ANTIGEN: Hepatitis B Surface Ag: NEGATIVE

## 2022-06-26 LAB — CYTOLOGY - PAP
Chlamydia: NEGATIVE
Comment: NEGATIVE
Comment: NEGATIVE
Comment: NORMAL
Diagnosis: UNDETERMINED — AB
High risk HPV: POSITIVE — AB
Neisseria Gonorrhea: NEGATIVE

## 2023-05-10 ENCOUNTER — Other Ambulatory Visit: Payer: Self-pay

## 2023-05-10 ENCOUNTER — Encounter (HOSPITAL_BASED_OUTPATIENT_CLINIC_OR_DEPARTMENT_OTHER): Payer: Self-pay | Admitting: Emergency Medicine

## 2023-05-10 ENCOUNTER — Emergency Department (HOSPITAL_BASED_OUTPATIENT_CLINIC_OR_DEPARTMENT_OTHER)
Admission: EM | Admit: 2023-05-10 | Discharge: 2023-05-10 | Disposition: A | Discharge status: Home / Self Care | Payer: Medicaid Other | Attending: Emergency Medicine | Admitting: Emergency Medicine

## 2023-05-10 DIAGNOSIS — Z20822 Contact with and (suspected) exposure to covid-19: Secondary | ICD-10-CM | POA: Diagnosis not present

## 2023-05-10 DIAGNOSIS — Z79899 Other long term (current) drug therapy: Secondary | ICD-10-CM | POA: Insufficient documentation

## 2023-05-10 DIAGNOSIS — J101 Influenza due to other identified influenza virus with other respiratory manifestations: Secondary | ICD-10-CM | POA: Diagnosis not present

## 2023-05-10 DIAGNOSIS — J45909 Unspecified asthma, uncomplicated: Secondary | ICD-10-CM | POA: Insufficient documentation

## 2023-05-10 DIAGNOSIS — J09X2 Influenza due to identified novel influenza A virus with other respiratory manifestations: Secondary | ICD-10-CM | POA: Diagnosis not present

## 2023-05-10 DIAGNOSIS — R519 Headache, unspecified: Secondary | ICD-10-CM

## 2023-05-10 LAB — RESP PANEL BY RT-PCR (RSV, FLU A&B, COVID)  RVPGX2
Influenza A by PCR: POSITIVE — AB
Influenza B by PCR: NEGATIVE
Resp Syncytial Virus by PCR: NEGATIVE
SARS Coronavirus 2 by RT PCR: NEGATIVE

## 2023-05-10 LAB — PREGNANCY, URINE: Preg Test, Ur: NEGATIVE

## 2023-05-10 MED ORDER — KETOROLAC TROMETHAMINE 15 MG/ML IJ SOLN
15.0000 mg | Freq: Once | INTRAMUSCULAR | Status: AC
  Administered 2023-05-10: 15 mg via INTRAVENOUS
  Filled 2023-05-10: qty 1

## 2023-05-10 MED ORDER — METOCLOPRAMIDE HCL 10 MG PO TABS: 10.0000 mg | ORAL_TABLET | Freq: Four times a day (QID) | ORAL | 0 refills | Status: DC

## 2023-05-10 MED ORDER — METOCLOPRAMIDE HCL 5 MG/ML IJ SOLN
10.0000 mg | Freq: Once | INTRAMUSCULAR | Status: AC
  Administered 2023-05-10: 10 mg via INTRAVENOUS
  Filled 2023-05-10: qty 2

## 2023-05-10 MED ORDER — DIPHENHYDRAMINE HCL 50 MG/ML IJ SOLN
25.0000 mg | Freq: Once | INTRAMUSCULAR | Status: AC
  Administered 2023-05-10: 25 mg via INTRAVENOUS
  Filled 2023-05-10: qty 1

## 2023-05-10 NOTE — ED Provider Notes (Signed)
[place] EMERGENCY DEPARTMENT AT [facility] Provider Note   CSN: [id number] Arrival date & time: 05/10/23  1939     History {Add pertinent medical, surgical, social history, OB history to HPI:1} Chief Complaint  Patient presents with   Cough    Amy Petersen is a 23 y.o. female.  23 year old female with a history of asthma who presents to the emergency department with sinus pain.  Patient reports that for the past 4 days she has had congestion, subjective fevers, productive cough, and headache.  Describes the headache is behind her nose and sharp.  8/10 in severity at its worst. Says that it comes in waves.  Went to urgent care and had a negative COVID test and was prescribed you a tall and caffeine.  Says that she is having persistent symptoms so decided to come into the emergency department for evaluation.       Home Medications Prior to Admission medications   Medication Sig Start Date End Date Taking? Authorizing Provider  albuterol (VENTOLIN HFA) 108 (90 Base) MCG/ACT inhaler Inhale 2 puffs into the lungs every 6 (six) hours as needed for wheezing or shortness of breath. 02/22/21   Sabino Dick, DO  budesonide-formoterol (SYMBICORT) 160-4.5 MCG/ACT inhaler Inhale 2 puffs into the lungs in the morning and at bedtime. 04/06/22   Rolan Bucco, MD  Ibuprofen 200 MG CAPS Take 1 capsule by mouth daily as needed (for pain).    [provider]  predniSONE (DELTASONE) 20 MG tablet 2 tabs po daily x 4 days 04/06/22   Rolan Bucco, MD  Spacer/Aero-Holding Chambers (AEROCHAMBER MV) inhaler Use as instructed 09/13/21   Omar Person, MD  Spacer/Aero-Holding Rudean Curt Use as directed 09/12/21   Omar Person, MD      Allergies    Patient has no known allergies.    Review of Systems   Review of Systems  Physical Exam Updated Vital Signs BP (!) 130/96 (BP Location: Right Arm)   Pulse 90   Temp (!) 97.3 F (36.3 C)   Resp 18   LMP  04/26/2023 (Approximate)   SpO2 100%  Physical Exam Vitals and nursing note reviewed.  Constitutional:      General: She is not in acute distress.    Appearance: She is well-developed.  HENT:     Head: Normocephalic and atraumatic.     Right Ear: External ear normal.     Left Ear: External ear normal.     Nose: Nose normal.  Eyes:     Extraocular Movements: Extraocular movements intact.     Conjunctiva/sclera: Conjunctivae normal.     Pupils: Pupils are equal, round, and reactive to light.  Cardiovascular:     Rate and Rhythm: Normal rate and regular rhythm.     Heart sounds: No murmur heard. Pulmonary:     Effort: Pulmonary effort is normal. No respiratory distress.     Breath sounds: Normal breath sounds.  Musculoskeletal:     Cervical back: Normal range of motion and neck supple.     Right lower leg: No edema.     Left lower leg: No edema.  Skin:    General: Skin is warm and dry.  Neurological:     Mental Status: She is alert.     Comments: MENTAL STATUS: AAOx3 CRANIAL NERVES: II: Pupils equal and reactive 4 mm BL, no RAPD, no VF deficits III, IV, VI: EOM intact, no gaze preference or deviation, no nystagmus. V: normal sensation to light touch  in V1, V2, and V3 segments bilaterally VII: no facial weakness or asymmetry, no nasolabial fold flattening VIII: normal hearing to speech and finger friction IX, X: normal palatal elevation, no uvular deviation XI: 5/5 head turn and 5/5 shoulder shrug bilaterally XII: midline tongue protrusion MOTOR: 5/5 strength in R shoulder flexion, elbow flexion and extension, and grip strength. 5/5 strength in L shoulder flexion, elbow flexion and extension, and grip strength.  5/5 strength in R hip and knee flexion, knee extension, ankle plantar and dorsiflexion. 5/5 strength in L hip and knee flexion, knee extension, ankle plantar and dorsiflexion. SENSORY: Normal sensation to light touch in all extremities COORD: Normal finger to nose and  heel to shin, no tremor, no dysmetria  Psychiatric:        Mood and Affect: Mood normal.     ED Results / Procedures / Treatments   Labs (all labs ordered are listed, but only abnormal results are displayed) Labs Reviewed  RESP PANEL BY RT-PCR (RSV, FLU A&B, COVID)  RVPGX2    EKG None  Radiology No results found.  Procedures Procedures  {Document cardiac monitor, telemetry assessment procedure when appropriate:1}  Medications Ordered in ED Medications - No data to display  ED Course/ Medical Decision Making/ A&P   {   Click here for ABCD2, HEART and other calculatorsREFRESH Note before signing :1}                              Medical Decision Making Amount and/or Complexity of Data Reviewed Labs: ordered.  Risk Prescription drug management.   ***  {Document critical care time when appropriate:1} {Document review of labs and clinical decision tools ie heart score, Chads2Vasc2 etc:1}  {Document your independent review of radiology images, and any outside records:1} {Document your discussion with family members, caretakers, and with consultants:1} {Document social determinants of health affecting pt's care:1} {Document your decision making why or why not admission, treatments were needed:1} Final Clinical Impression(s) / ED Diagnoses Final diagnoses:  None    Rx / DC Orders ED Discharge Orders     None

## 2023-05-10 NOTE — ED Triage Notes (Signed)
Cough nasal congestion. Runny nose headache x 4 days

## 2023-05-10 NOTE — Discharge Instructions (Addendum)
You were seen for your headache in the emergency department.  You were found to have the flu. Your pregnancy test was negative.   At home, please take Tylenol and ibuprofen for your headache.  You may also take the reglan for your headache but please note that it may make you drowsy. You may continue sinus rinses.    Check your MyChart online for the results of any tests that had not resulted by the time you left the emergency department.   Follow-up with your primary doctor in 2-3 days regarding your visit.    Return immediately to the emergency department if you experience any of the following: Double vision, blurry vision, severe neck stiffness, or any other concerning symptoms.    Thank you for visiting our Emergency Department. It was a pleasure taking care of you today.

## 2023-07-09 NOTE — Progress Notes (Unsigned)
    SUBJECTIVE:   CHIEF COMPLAINT: asthma  HPI:   Amy Petersen is a 23 y.o.  with history notable for reported asthma with normal PFT presenting for follow up.   PERTINENT  PMH / PSH/Family/Social History : ***  OBJECTIVE:   There were no vitals taken for this visit.  Today's weight:  Review of prior weights: There were no vitals filed for this visit.  ***  ASSESSMENT/PLAN:   Assessment & Plan Wheezing    Terisa Starr, MD  Family Medicine Teaching Service  [facility]

## 2023-07-10 ENCOUNTER — Encounter: Payer: Self-pay | Admitting: Family Medicine

## 2023-07-10 ENCOUNTER — Ambulatory Visit: Admitting: Family Medicine

## 2023-07-10 VITALS — BP 109/76 | HR 70 | Ht 63.0 in | Wt 139.0 lb

## 2023-07-10 DIAGNOSIS — R0602 Shortness of breath: Secondary | ICD-10-CM

## 2023-07-10 DIAGNOSIS — Z23 Encounter for immunization: Secondary | ICD-10-CM | POA: Diagnosis not present

## 2023-07-10 DIAGNOSIS — R062 Wheezing: Secondary | ICD-10-CM

## 2023-07-10 DIAGNOSIS — J301 Allergic rhinitis due to pollen: Secondary | ICD-10-CM

## 2023-07-10 MED ORDER — ALBUTEROL SULFATE HFA 108 (90 BASE) MCG/ACT IN AERS: 2.0000 | INHALATION_SPRAY | Freq: Four times a day (QID) | RESPIRATORY_TRACT | 6 refills | Status: DC | PRN

## 2023-07-10 MED ORDER — FLUTICASONE PROPIONATE 50 MCG/ACT NA SUSP: 2.0000 | Freq: Every day | NASAL | 6 refills | Status: DC

## 2023-07-10 NOTE — Patient Instructions (Signed)
 It was wonderful to see you today.  Please bring ALL of your medications with you to every visit.   Today we talked about:  - Flonase-- 2 sprays in each nostril once a day   - Please use the prescribed albuterol inhaler  Good luck with the [employer] and CONGRATULATIONS on graduation!!!    Please follow up in 12 months   Thank you for choosing [facility].   Please call [phone] with any questions about today's appointment.  Please be sure to schedule follow up at the front  desk before you leave today.   Terisa Starr, MD  Family Medicine

## 2023-07-10 NOTE — Addendum Note (Signed)
 Addended by: Penni Bombard on: 07/10/2023 11:35 AM   Modules accepted: Orders

## 2023-08-12 DIAGNOSIS — S6992XA Unspecified injury of left wrist, hand and finger(s), initial encounter: Secondary | ICD-10-CM | POA: Diagnosis not present

## 2023-09-17 NOTE — Progress Notes (Unsigned)
    SUBJECTIVE:   Chief compliant/HPI: annual examination  Amy Petersen is a 23 y.o. who presents today for an annual exam.   The patient reports she is concerned about her menses.  She had a normal menstrual cycle in the beginning of May.  And then had 3 days of spotting after it ended.  This was brownish in color.  She is 1 sexual partner.  She uses only condoms for contraception.  She is not sure about taking birth control or folate at this time.  She is not interested having a child for some time but does not want to take birth control due to potential side effects.  She is aware of all the forms.  She is due for her Pap today.  She has been increasing activity.  No excess fatigue no skin changes no hair changes.   The patient will take her citizenship test later this year early in 2026  Review of systems form notable for as above.   Updated history tabs and problem list both parents are deceased.  Updated her family social personal medical surgical history..   OBJECTIVE:   BP 113/81   Pulse 77   Ht 5\' 3"  (1.6 m)   Wt 135 lb 9.6 oz (61.5 kg)   SpO2 100%   BMI 24.02 kg/m   HEENT: EOMI. Sclera without injection or icterus. MMM. External auditory canal examined and WNL. TM normal appearance, no erythema or bulging. Neck: Supple.  Cardiac: Regular rate and rhythm. Normal S1/S2. No murmurs, rubs, or gallops appreciated. Lungs: Clear bilaterally to ascultation.  Abdomen: Normoactive bowel sounds. No tenderness to deep or light palpation. No rebound or guarding.   Psych: Pleasant and appropriate  GU Exam:    External exam: Normal-appearing female external genitalia.  Vaginal exam notable for moderate normal discharge.  Cervix without discharge or obvious lesion.   Chaperoned examine, CMA Dayshia.    ASSESSMENT/PLAN:   Assessment & Plan Irregular menses Discussed possible causes including increased active and other.  Pregnancy test negative today.  Cervical and vaginal exam  unremarkable.  Have sent for infection testing. History of abnormal cells from cervix Repeat today Annual physical exam As below Mood change  Annual Examination  See AVS for age appropriate recommendations.   PHQ score 0, reviewed and discussed.  The patient reports her mood is good but she is interested in counseling.  She is interested in talking to a Child psychotherapist about this and being connected to the value-based care Institute.  A referral was placed Blood pressure reviewed and at goal. No SI/HI.  Asked about intimate partner violence and patient reports none.  The patient currently uses nothing for contraception. Folate recommended as appropriate, minimum of 400 mcg per day.     Considered the following items based upon USPSTF recommendations: HIV testing: ordered Hepatitis C: ordered Hepatitis B: ordered Syphilis if at high risk: ordered Cervical cancer screening: due for Pap today, cytology alone ordered (HPV if ASCUS) Immunizations   MyChart Activation:Already signed up   Follow up in 1  year or sooner if indicated.    Azell Boll, MD [facility]

## 2023-09-18 ENCOUNTER — Ambulatory Visit (INDEPENDENT_AMBULATORY_CARE_PROVIDER_SITE_OTHER): Admitting: Family Medicine

## 2023-09-18 ENCOUNTER — Other Ambulatory Visit (HOSPITAL_COMMUNITY)
Admission: RE | Admit: 2023-09-18 | Discharge: 2023-09-18 | Disposition: A | Discharge status: Home / Self Care | Source: Ambulatory Visit | Attending: Family Medicine | Admitting: Family Medicine

## 2023-09-18 VITALS — BP 113/81 | HR 77 | Ht 63.0 in | Wt 135.6 lb

## 2023-09-18 DIAGNOSIS — Z0000 Encounter for general adult medical examination without abnormal findings: Secondary | ICD-10-CM | POA: Diagnosis not present

## 2023-09-18 DIAGNOSIS — Z87898 Personal history of other specified conditions: Secondary | ICD-10-CM | POA: Diagnosis not present

## 2023-09-18 DIAGNOSIS — N926 Irregular menstruation, unspecified: Secondary | ICD-10-CM | POA: Diagnosis not present

## 2023-09-18 DIAGNOSIS — R4586 Emotional lability: Secondary | ICD-10-CM | POA: Diagnosis not present

## 2023-09-18 LAB — POCT URINE PREGNANCY: Preg Test, Ur: NEGATIVE

## 2023-09-18 LAB — POCT WET PREP (WET MOUNT)
Clue Cells Wet Prep Whiff POC: NEGATIVE
Trichomonas Wet Prep HPF POC: ABSENT

## 2023-09-18 NOTE — Patient Instructions (Addendum)
 It was wonderful to see you today.  Please bring ALL of your medications with you to every visit.   Today we talked about: It is common to have irregular menses when changing exercise/activity or with stress   Please track your period--if irregular for 3 months in a row please return   YOU ARE DOING GREAT WITH YOUR HEALTH    We will check blood work  I will call you with results  I recommend taking a prenatal vitamin every day  Please schedule a visit if you are interested in birth control    Today at your annual preventive visit we talked about the following measures:   I recommend 150 minutes of exercise per week-try 30 minutes 5 days per week We discussed reducing sugary beverages (like soda and juice) and increasing leafy greens and whole fruits.  We discussed avoiding tobacco and alcohol.  I recommend avoiding illicit substances.  Contraception and options    Please follow up in 12 months   Thank you for choosing [facility].   Please call [phone] with any questions about today's appointment.  Please be sure to schedule follow up at the front  desk before you leave today.   Otho Blitz, MD  Family Medicine

## 2023-09-19 LAB — CBC
Hematocrit: 41 % (ref 34.0–46.6)
Hemoglobin: 13.3 g/dL (ref 11.1–15.9)
MCH: 27.7 pg (ref 26.6–33.0)
MCHC: 32.4 g/dL (ref 31.5–35.7)
MCV: 85 fL (ref 79–97)
Platelets: 343 10*3/uL (ref 150–450)
RBC: 4.8 x10E6/uL (ref 3.77–5.28)
RDW: 13.3 % (ref 11.7–15.4)
WBC: 4.5 10*3/uL (ref 3.4–10.8)

## 2023-09-19 LAB — HIV ANTIBODY (ROUTINE TESTING W REFLEX): HIV Screen 4th Generation wRfx: NONREACTIVE

## 2023-09-19 LAB — HCV INTERPRETATION

## 2023-09-19 LAB — HEPATITIS B SURFACE ANTIGEN: Hepatitis B Surface Ag: NEGATIVE

## 2023-09-19 LAB — FERRITIN: Ferritin: 151 ng/mL — ABNORMAL HIGH (ref 15–150)

## 2023-09-19 LAB — HCV AB W REFLEX TO QUANT PCR: HCV Ab: NONREACTIVE

## 2023-09-19 LAB — RPR: RPR Ser Ql: NONREACTIVE

## 2023-09-20 ENCOUNTER — Ambulatory Visit: Payer: Self-pay | Admitting: Family Medicine

## 2023-09-25 LAB — CYTOLOGY - PAP
Adequacy: ABSENT
Chlamydia: NEGATIVE
Comment: NEGATIVE
Comment: NEGATIVE
Comment: NEGATIVE
Comment: NEGATIVE
Comment: NEGATIVE
Comment: NORMAL
Diagnosis: UNDETERMINED — AB
HPV 16: NEGATIVE
HPV 18 / 45: NEGATIVE
High risk HPV: POSITIVE — AB
Neisseria Gonorrhea: NEGATIVE
Trichomonas: NEGATIVE

## 2023-09-29 ENCOUNTER — Telehealth: Payer: Self-pay | Admitting: *Deleted

## 2023-09-29 NOTE — Progress Notes (Signed)
 Complex Care Management Note Care Guide Note  09/29/2023 Name: Amy Petersen MRN: [id number] DOB: Jul 06, 2000   Complex Care Management Outreach Attempts: An unsuccessful telephone outreach was attempted today to offer the patient information about available complex care management services.  Follow Up Plan:  Additional outreach attempts will be made to offer the patient complex care management information and services.   Encounter Outcome:  No Answer  Barnie Bora  [facility]  [facility], [facility] Guide  Direct Dial: [phone]  Fax [phone]

## 2023-09-29 NOTE — Progress Notes (Signed)
 Complex Care Management Note  Care Guide Note 09/29/2023 Name: Dakotah Heiman MRN: [id number] DOB: 2000-10-14  Elliyah Liszewski is a 23 y.o. year old female who sees Azell Boll, MD for primary care. I reached out to [facility] by phone today to offer complex care management services.  Ms. Boschert was given information about Complex Care Management services today including:   The Complex Care Management services include support from the care team which includes your Nurse Care Manager, Clinical Social Worker, or Pharmacist.  The Complex Care Management team is here to help remove barriers to the health concerns and goals most important to you. Complex Care Management services are voluntary, and the patient may decline or stop services at any time by request to their care team member.   Complex Care Management Consent Status: Patient agreed to services and verbal consent obtained.   Follow up plan:  Telephone appointment with complex care management team member scheduled for:  09/30/23  Encounter Outcome:  Patient Scheduled  Barnie Bora  [facility]  [facility] Guide  Direct Dial: [phone]  Fax [phone]

## 2023-09-30 ENCOUNTER — Other Ambulatory Visit: Payer: Self-pay | Admitting: Licensed Clinical Social Worker

## 2023-09-30 DIAGNOSIS — Z7189 Other specified counseling: Secondary | ICD-10-CM

## 2023-09-30 NOTE — Patient Outreach (Signed)
 Complex Care Management   Visit Note  09/30/2023  Name:  Amy Petersen MRN: [id number] DOB: [birth year]  Situation: Referral received for Complex Care Management related to SDOH Barriers:  Stress I obtained verbal consent from Patient.  Visit completed with pt  on the phone  Background:   Past Medical History:  Diagnosis Date   Malaria    Recurrent UTI    Typhoid     Assessment: Patient Reported Symptoms:  Cognitive Cognitive Status: Alert and oriented to person, place, and time, Normal speech and language skills Cognitive/Intellectual Conditions Management [RPT]: None reported or documented in medical history or problem list      Neurological Neurological Review of Symptoms: No symptoms reported    HEENT HEENT Symptoms Reported: No symptoms reported      Cardiovascular Cardiovascular Symptoms Reported: No symptoms reported    Respiratory Respiratory Symptoms Reported: No symptoms reported    Endocrine Patient reports the following symptoms related to hypoglycemia or hyperglycemia : No symptoms reported Is patient diabetic?: No    Gastrointestinal Gastrointestinal Symptoms Reported: No symptoms reported      Genitourinary Genitourinary Symptoms Reported: No symptoms reported    Integumentary Integumentary Symptoms Reported: No symptoms reported    Musculoskeletal Musculoskelatal Symptoms Reviewed: No symptoms reported        Psychosocial Psychosocial Symptoms Reported: No symptoms reported Additional Psychological Details: Pt is interested in strengthening social support in form of counseling. Prefers individual therapy. Healthy coping skills discussed Behavioral Management Strategies: Coping strategies, Support system Techniques to Cope with Loss/Stress/Change: Diversional activities, Exercise Quality of Family Relationships: supportive Do you feel physically threatened by others?: No      06/23/2022    9:55 AM  Depression screen PHQ 2/9  Decreased Interest 0   Down, Depressed, Hopeless 0  PHQ - 2 Score 0  Altered sleeping 0  Tired, decreased energy 0  Change in appetite 0  Feeling bad or failure about yourself  0  Trouble concentrating 0  Moving slowly or fidgety/restless 0  Suicidal thoughts 0  PHQ-9 Score 0  Difficult doing work/chores Not difficult at all    There were no vitals filed for this visit.  Medications Reviewed Today     Reviewed by Adriana Albany, LCSW (Social Worker) on 09/30/23 at 1120  Med List Status: <None>   Medication Order Taking? Sig Documenting Provider Last Dose Status Informant  albuterol  (VENTOLIN  HFA) 108 (90 Base) MCG/ACT inhaler [id number] No Inhale 2 puffs into the lungs every 6 (six) hours as needed for wheezing or shortness of breath.  Patient not taking: Reported on 09/30/2023   Azell Boll, MD Not Taking Active   fluticasone  (FLONASE ) 50 MCG/ACT nasal spray [id number] No Place 2 sprays into both nostrils daily.  Patient not taking: Reported on 09/30/2023   Azell Boll, MD Not Taking Active             Recommendation:   Continue Current Plan of Care  Follow Up Plan:   Telephone follow-up 2 weeks  Alease Hunter, LCSW [place]  [facility], [facility] Clinical Social Worker Direct Dial: [phone]  Fax: [phone] Website: [email or web address] 1:21 PM

## 2023-09-30 NOTE — Patient Instructions (Signed)
 Visit Information  Amy Petersen was given information about Medicaid Managed Care team care coordination services as a part of their Healthy [facility] Medicaid benefit. Amy Petersen verbally consented to engagement with the [facility] Managed Care team.   If you are experiencing a medical emergency, please call 911 or report to your local emergency department or urgent care.   If you have a non-emergency medical problem during routine business hours, please contact your provider's office and ask to speak with a nurse.   For questions related to your Healthy [facility] health plan, please call: [phone] or visit the homepage here: [email or web address]  If you would like to schedule transportation through your Healthy [facility] plan, please call the following number at least 2 days in advance of your appointment: [phone]  For information about your ride after you set it up, call Ride Assist at [phone]. Use this number to activate a Will Call pickup, or if your transportation is late for a scheduled pickup. Use this number, too, if you need to make a change or cancel a previously scheduled reservation.  If you need transportation services right away, call [phone]. The after-hours call center is staffed 24 hours to handle ride assistance and urgent reservation requests (including discharges) 365 days a year. Urgent trips include sick visits, hospital discharge requests and life-sustaining treatment.  Call the [place] [facility] Line at [phone], at any time, 24 hours a day, 7 days a week. If you are in danger or need immediate medical attention call 911.  If you would like help to quit smoking, call 1-800-QUIT-NOW (367 281 6342) OR Espaol: 1-855-Djelo-Ya (1-884-166-0630) o para ms informacin haga clic aqu or Text READY to 160-109 to register via text  Amy Petersen - following are the goals we discussed in your visit today:   Goals  Addressed             This Visit's Progress    LCSW VBCI Social Work Care Plan   On track    Problems:   Limited social support  CSW Clinical Goal(s):   Over the next 60 days the Patient will attend all scheduled medical appointments as evidenced by patient report and care team review of appointment completion in electronic MEDICAL RECORD NUMBER  work with Child psychotherapist to address concerns related to strengthening support in the community.  Interventions:  Mental Health:  Evaluation of current treatment plan related to Stress Active listening / Reflection utilized Financial risk analyst / information provided Emotional Support Provided Provided general psycho-education for mental health needs Solution-Focued Strategies employed:  Patient Goals/Self-Care Activities:  Increase coping skills and healthy habits  Plan:   Telephone follow up appointment with care management team member scheduled for:  2-4 weeks        Please see education materials related to topics discussed provided by MyChart link.  Patient verbalizes understanding of instructions and care plan provided today and agrees to view in MyChart. Active MyChart status and patient understanding of how to access instructions and care plan via MyChart confirmed with patient.     Licensed Clinical Social Worker will f/up with you on 06/25  Amy Hunter, LCSW [place]  [facility], [facility] Clinical Social Worker Direct Dial: [phone]  Fax: [phone] Website: [email or web address] 1:23 PM   Following is a copy of your plan of care:  There are no care plans that you recently modified to display for this patient.

## 2023-10-13 ENCOUNTER — Telehealth: Payer: Self-pay

## 2023-10-13 NOTE — Progress Notes (Signed)
 Complex Care Management Note Care Guide Note  10/13/2023 Name: Amy Petersen MRN: [id number] DOB: [birth year]  Amy Petersen is a 23 y.o. year old female who is a primary care patient of Delores Suzann HERO, MD . The community resource team was consulted for assistance with Food Insecurity  SDOH screenings and interventions completed:  Yes  Social Drivers of Health From This Encounter   Food Insecurity: Food Insecurity Present (10/13/2023)   Hunger Vital Sign    Worried About Running Out of Food in the Last Year: Sometimes true    Ran Out of Food in the Last Year: Sometimes true  Housing: Unknown (10/13/2023)   Housing Stability Vital Sign    Unable to Pay for Housing in the Last Year: Patient declined    Number of Times Moved in the Last Year: 0    Homeless in the Last Year: Patient declined  Financial Resource Strain: Medium Risk (10/13/2023)   Overall Financial Resource Strain (CARDIA)    Difficulty of Paying Living Expenses: Somewhat hard  Transportation Needs: No Transportation Needs (10/13/2023)   PRAPARE - Administrator, Civil Service (Medical): No    Lack of Transportation (Non-Medical): No  Utilities: Not At Risk (10/13/2023)   Utilities    Threatened with loss of utilities: No    SDOH Interventions Today    Flowsheet Row Most Recent Value  SDOH Interventions   Food Insecurity Interventions Other (Comment)  [Verified patient's email sent food pantry list for [place].]  Housing Interventions Patient Declined     Care guide performed the following interventions: Patient provided with information about care guide support team and interviewed to confirm resource needs.  Follow Up Plan:  No further follow up planned at this time. The patient has been provided with needed resources.  Encounter Outcome:  Patient Visit Completed  Alyla Pietila [facility]  [facility] Guide Direct Dial: [phone]  Fax:  [phone] Website: [email or web address]

## 2023-10-14 ENCOUNTER — Other Ambulatory Visit: Payer: Self-pay | Admitting: Licensed Clinical Social Worker

## 2023-10-20 NOTE — Patient Instructions (Signed)
 Visit Information  Ms. Neas was given information about Medicaid Managed Care team care coordination services as a part of their Healthy [facility] Medicaid benefit. Lareta Bruneau verbally consented to engagement with the [facility] Managed Care team.   If you are experiencing a medical emergency, please call 911 or report to your local emergency department or urgent care.   If you have a non-emergency medical problem during routine business hours, please contact your provider's office and ask to speak with a nurse.   For questions related to your Healthy [facility] health plan, please call: [phone] or visit the homepage here: [email or web address]  If you would like to schedule transportation through your Healthy [facility] plan, please call the following number at least 2 days in advance of your appointment: [phone]  For information about your ride after you set it up, call Ride Assist at [phone]. Use this number to activate a Will Call pickup, or if your transportation is late for a scheduled pickup. Use this number, too, if you need to make a change or cancel a previously scheduled reservation.  If you need transportation services right away, call [phone]. The after-hours call center is staffed 24 hours to handle ride assistance and urgent reservation requests (including discharges) 365 days a year. Urgent trips include sick visits, hospital discharge requests and life-sustaining treatment.  Call the [facility] Line at [phone], at any time, 24 hours a day, 7 days a week. If you are in danger or need immediate medical attention call 911.  If you would like help to quit smoking, call 1-800-QUIT-NOW (503-862-2492) OR Espaol: 1-855-Djelo-Ya (8-144-664-6430) o para ms informacin haga clic aqu or Text READY to 799-599 to register via text  Amy Petersen - following are the goals we discussed in your visit today:   Goals  Addressed             This Visit's Progress    LCSW VBCI Social Work Care Plan   On track    Problems:   Limited social support  CSW Clinical Goal(s):   Over the next 60 days the Patient will attend all scheduled medical appointments as evidenced by patient report and care team review of appointment completion in electronic MEDICAL RECORD NUMBER  work with Child psychotherapist to address concerns related to strengthening support in the community.  Interventions:  Mental Health:  Evaluation of current treatment plan related to Stress Active listening / Reflection utilized Financial risk analyst / information provided Emotional Support Provided Provided general psycho-education for mental health needs Solution-Focued Strategies employed: Pt agreed to [employer] completing referral to the [employer] for [place] for counseling. LCSW provided information via email for pt to review  Patient Goals/Self-Care Activities:  Increase coping skills and healthy habits  Plan:   Telephone follow up appointment with care management team member scheduled for:  2-4 weeks        Please see education materials related to topics discussed provided by MyChart link.  Patient verbalizes understanding of instructions and care plan provided today and agrees to view in MyChart. Active MyChart status and patient understanding of how to access instructions and care plan via MyChart confirmed with patient.     Licensed Clinical Social Worker will follow up with you on 07/07 at 9:30 AM  Rolin Kerns, LCSW [place]  [facility], [facility] Clinical Social Worker Direct Dial: [phone]  Fax: [phone] Website: [email or web address] 9:51 AM   Following is a copy of your plan of care:  There are no care plans that you recently modified to display for this patient.

## 2023-10-20 NOTE — Patient Outreach (Signed)
 Complex Care Management   Visit Note  10/14/2023  Name:  Amy Petersen MRN: [id number] DOB: Mar 19, 2001  Situation: Referral received for Complex Care Management related to Stress I obtained verbal consent from Patient.  Visit completed with pt  on the phone  Background:   Past Medical History:  Diagnosis Date   Malaria    Recurrent UTI    Typhoid     Assessment: Patient Reported Symptoms:  Cognitive Cognitive Status: Alert and oriented to person, place, and time, Normal speech and language skills Cognitive/Intellectual Conditions Management [RPT]: None reported or documented in medical history or problem list      Neurological Neurological Review of Symptoms: Not assessed    HEENT HEENT Symptoms Reported: Not assessed      Cardiovascular Cardiovascular Symptoms Reported: Not assessed    Respiratory Respiratory Symptoms Reported: Not assesed    Endocrine Endocrine Symptoms Reported: Not assessed    Gastrointestinal Gastrointestinal Symptoms Reported: Not assessed      Genitourinary Genitourinary Symptoms Reported: Not assessed    Integumentary Integumentary Symptoms Reported: Not assessed    Musculoskeletal Musculoskelatal Symptoms Reviewed: Not assessed        Psychosocial Psychosocial Symptoms Reported: Other Other Psychosocial Conditions: Stress Additional Psychological Details: Patient reports no changes since last visit. A referral for counseling will be completed            06/23/2022    9:55 AM  Depression screen PHQ 2/9  Decreased Interest 0  Down, Depressed, Hopeless 0  PHQ - 2 Score 0  Altered sleeping 0  Tired, decreased energy 0  Change in appetite 0  Feeling bad or failure about yourself  0  Trouble concentrating 0  Moving slowly or fidgety/restless 0  Suicidal thoughts 0  PHQ-9 Score 0  Difficult doing work/chores Not difficult at all    There were no vitals filed for this visit.  Medications Reviewed Today     Reviewed by Ezzard Rolin BIRCH, LCSW (Social Worker) on 10/20/23 at 0940  Med List Status: <None>   Medication Order Taking? Sig Documenting Provider Last Dose Status Informant  albuterol  (VENTOLIN  HFA) 108 (90 Base) MCG/ACT inhaler [id number] No Inhale 2 puffs into the lungs every 6 (six) hours as needed for wheezing or shortness of breath.  Patient not taking: Reported on 09/30/2023   Delores Suzann HERO, MD Not Taking Active   fluticasone  (FLONASE ) 50 MCG/ACT nasal spray [id number] No Place 2 sprays into both nostrils daily.  Patient not taking: Reported on 09/30/2023   Delores Suzann HERO, MD Not Taking Active             Recommendation:   Continue Current Plan of Care  Follow Up Plan:   Telephone follow-up 2-4 weeks  Rolin Ezzard, LCSW [facility]  [facility], [facility] Clinical Social Worker Direct Dial: [phone]  Fax: [phone] Website: [email or web address] 9:50 AM

## 2023-10-21 ENCOUNTER — Ambulatory Visit (INDEPENDENT_AMBULATORY_CARE_PROVIDER_SITE_OTHER): Admitting: Student

## 2023-10-21 ENCOUNTER — Encounter: Payer: Self-pay | Admitting: Student

## 2023-10-21 VITALS — BP 102/68 | HR 68 | Temp 98.5°F | Wt 132.2 lb

## 2023-10-21 DIAGNOSIS — Z3046 Encounter for surveillance of implantable subdermal contraceptive: Secondary | ICD-10-CM

## 2023-10-21 DIAGNOSIS — Z30017 Encounter for initial prescription of implantable subdermal contraceptive: Secondary | ICD-10-CM

## 2023-10-21 NOTE — Progress Notes (Signed)
    SUBJECTIVE:   CHIEF COMPLAINT / HPI:   23 year old female with no significant past medical history Presenting today for birth control management Patient expressed desire for Nexplanon  implants Previously tried Nexplanon  but I did remove the Valium due to concerns of weight gain, reduced stamina. Also tried oral birth control alone but discontinued due to no insurance. Sexually active with reported last intercourse over a month ago. LMP was about 10/07/2023.   PERTINENT  PMH / PSH: Reviewed   OBJECTIVE:   BP 102/68   Pulse 68   Temp 98.5 F (36.9 C) (Oral)   Wt 132 lb 3.2 oz (60 kg)   LMP 10/07/2023 (Approximate)   SpO2 96%   BMI 23.42 kg/m    Physical Exam General: Alert, well appearing, NAD Cardiovascular: RRR, No Murmurs, Normal S2/S2 Respiratory: CTAB, No wheezing or Rales Abdomen: No distension or tenderness Extremities: No edema on extremities   Skin: Warm and dry  ASSESSMENT/PLAN:    GYNECOLOGY PROCEDURE NOTE  Amy Petersen is a 23 y.o. No obstetric history on file. here for Nexplanon  insertion. No other gynecologic concerns.  Nexplanon  Insertion Patient identified, informed consent performed, consent signed.   Patient does understand that irregular bleeding is a very common side effect of this medication. She was advised to have backup contraception for one week after placement. Pregnancy test in clinic today was negative.  Appropriate time out taken.  Patient's left arm was prepped and draped in the usual sterile fashion. The insertion area was measured and marked.  Patient was prepped with alcohol swab and then injected with 3 ml of 1% lidocaine.  The area was then prepped with betadine, Nexplanon  removed from packaging,  device confirmed present within needle, then inserted full length of needle and withdrawn per handbook instructions. Nexplanon  was able to palpated in the patient's arm; patient palpated the insert herself. There was minimal blood loss.   Patient insertion site covered with guaze and a pressure bandage to reduce any bruising.  The patient tolerated the procedure well and was given post procedure instructions.   Amy April, MD 10/21/2023, 9:12 AM PGY-3, [facility]    Amy April, MD [facility]

## 2023-10-21 NOTE — Patient Instructions (Addendum)
 Pleasure to meet you today.  Today we inserted your nexplanon  for birth control today  You could experience pain after the insertion. I recommend using tylenon or ibuprofen for pain  Avoid submerging the area in water for at least 36hrs   There could be bruising over the area. I recommend cold compress over the area   Your nexplanon  will be due for removal 3 years from now 09/21/2026

## 2023-10-26 ENCOUNTER — Other Ambulatory Visit: Payer: Self-pay | Admitting: Licensed Clinical Social Worker

## 2023-10-26 ENCOUNTER — Encounter: Payer: Self-pay | Admitting: Licensed Clinical Social Worker

## 2023-10-26 NOTE — Patient Instructions (Signed)
 Visit Information  Ms. Mirando was given information about Medicaid Managed Care team care coordination services as a part of their Healthy [facility] Medicaid benefit. Jaden Batchelder verbally consented to engagement with the [facility] Managed Care team.   If you are experiencing a medical emergency, please call 911 or report to your local emergency department or urgent care.   If you have a non-emergency medical problem during routine business hours, please contact your provider's office and ask to speak with a nurse.   For questions related to your Healthy [facility] health plan, please call: [phone] or visit the homepage here: [email or web address]  If you would like to schedule transportation through your Healthy [facility] plan, please call the following number at least 2 days in advance of your appointment: [phone]  For information about your ride after you set it up, call Ride Assist at [phone]. Use this number to activate a Will Call pickup, or if your transportation is late for a scheduled pickup. Use this number, too, if you need to make a change or cancel a previously scheduled reservation.  If you need transportation services right away, call [phone]. The after-hours call center is staffed 24 hours to handle ride assistance and urgent reservation requests (including discharges) 365 days a year. Urgent trips include sick visits, hospital discharge requests and life-sustaining treatment.  Call the [facility] Line at [phone], at any time, 24 hours a day, 7 days a week. If you are in danger or need immediate medical attention call 911.  If you would like help to quit smoking, call 1-800-QUIT-NOW (9188781576) OR Espaol: 1-855-Djelo-Ya (8-144-664-6430) o para ms informacin haga clic aqu or Text READY to 799-599 to register via text  Ms. Jamroz - following are the goals we discussed in your visit today:   Goals  Addressed             This Visit's Progress    LCSW VBCI Social Work Care Plan   On track    Problems:   Limited social support  CSW Clinical Goal(s):   Over the next 60 days the Patient will attend all scheduled medical appointments as evidenced by patient report and care team review of appointment completion in electronic MEDICAL RECORD NUMBER  work with Child psychotherapist to address concerns related to strengthening support in the community.  Interventions:  Mental Health:  Evaluation of current treatment plan related to Stress Active listening / Reflection utilized Financial risk analyst / information provided Emotional Support Provided Provided general psycho-education for mental health needs Solution-Focued Strategies employed: Pt has a scheduled appt with the [employer] for [place] for counseling.  Patient Goals/Self-Care Activities:  Increase coping skills and healthy habits  Plan:   Telephone follow up appointment with care management team member scheduled for:  2 weeks        Please see education materials related to topics discussed provided by MyChart link.  Patient verbalizes understanding of instructions and care plan provided today and agrees to view in MyChart. Active MyChart status and patient understanding of how to access instructions and care plan via MyChart confirmed with patient.     Licensed Clinical Social Worker will f/up on 07/22 at 3 PM  Rolin Kerns, LCSW [place]  [facility], [facility] Clinical Social Worker Direct Dial: [phone]  Fax: [phone] Website: [email or web address] 3:31 PM   Following is a copy of your plan of care:  There are no care plans that you recently modified to display for  this patient.

## 2023-10-26 NOTE — Patient Outreach (Signed)
 Complex Care Management   Visit Note  10/26/2023  Name:  Amy Petersen MRN: [id number] DOB: 03-12-2001  Situation: Referral received for Complex Care Management related to Stress I obtained verbal consent from Patient.  Visit completed with pt  on the phone  Background:   Past Medical History:  Diagnosis Date   Malaria    Recurrent UTI    Typhoid     Assessment: Patient Reported Symptoms:  Cognitive Cognitive Status: No symptoms reported, Normal speech and language skills, Alert and oriented to person, place, and time      Neurological Neurological Review of Symptoms: Not assessed    HEENT HEENT Symptoms Reported: Not assessed      Cardiovascular Cardiovascular Symptoms Reported: Not assessed    Respiratory Respiratory Symptoms Reported: Not assesed    Endocrine Endocrine Symptoms Reported: Not assessed    Gastrointestinal Gastrointestinal Symptoms Reported: Not assessed      Genitourinary Genitourinary Symptoms Reported: Not assessed    Integumentary Integumentary Symptoms Reported: Not assessed    Musculoskeletal Musculoskelatal Symptoms Reviewed: Not assessed        Psychosocial Psychosocial Symptoms Reported: No symptoms reported            10/21/2023    9:00 AM  Depression screen PHQ 2/9  Decreased Interest 0  Down, Depressed, Hopeless 0  PHQ - 2 Score 0  Altered sleeping 0  Tired, decreased energy 0  Change in appetite 0  Feeling bad or failure about yourself  0  Trouble concentrating 0  Moving slowly or fidgety/restless 0  Suicidal thoughts 0  PHQ-9 Score 0    There were no vitals filed for this visit.  Medications Reviewed Today     Reviewed by Jahlisa Rossitto D, LCSW (Social Worker) on 10/26/23 at 1512  Med List Status: <None>   Medication Order Taking? Sig Documenting Provider Last Dose Status Informant  albuterol  (VENTOLIN  HFA) 108 (90 Base) MCG/ACT inhaler [id number]  Inhale 2 puffs into the lungs every 6 (six) hours as needed for  wheezing or shortness of breath.  Patient not taking: Reported on 10/21/2023   Delores Suzann HERO, MD  Active   fluticasone  (FLONASE ) 50 MCG/ACT nasal spray [id number]  Place 2 sprays into both nostrils daily.  Patient not taking: Reported on 10/21/2023   Delores Suzann HERO, MD  Active             Recommendation:   Continue Current Plan of Care  Follow Up Plan:   Telephone follow-up 2 weeks  Rolin Kerns, LCSW [facility]  [facility], [facility] Clinical Social Worker Direct Dial: [phone]  Fax: [phone] Website: [email or web address] 3:30 PM

## 2023-11-02 MED ORDER — ETONOGESTREL 68 MG ~~LOC~~ IMPL
68.0000 mg | DRUG_IMPLANT | Freq: Once | SUBCUTANEOUS | Status: AC
  Administered 2023-10-21: 68 mg via SUBCUTANEOUS

## 2023-11-02 NOTE — Addendum Note (Signed)
 Addended by: Koleen Celia C on: 11/02/2023 02:02 PM   Modules accepted: Orders

## 2023-11-10 ENCOUNTER — Other Ambulatory Visit: Payer: Self-pay | Admitting: Licensed Clinical Social Worker

## 2023-11-11 NOTE — Patient Outreach (Signed)
 Complex Care Management   Visit Note  11/10/2023  Name:  Amy Petersen MRN: [id number] DOB: 2000/10/07  Situation: Referral received for Complex Care Management related to Stress Management I obtained verbal consent from Patient.  Visit completed with pt  on the phone  Background:   Past Medical History:  Diagnosis Date   Malaria    Recurrent UTI    Typhoid     Assessment: Patient Reported Symptoms:  Cognitive Cognitive Status: No symptoms reported, Alert and oriented to person, place, and time, Normal speech and language skills Cognitive/Intellectual Conditions Management [RPT]: None reported or documented in medical history or problem list      Neurological Neurological Review of Symptoms: Not assessed    HEENT HEENT Symptoms Reported: Not assessed      Cardiovascular Cardiovascular Symptoms Reported: Not assessed    Respiratory Respiratory Symptoms Reported: Not assesed    Endocrine Endocrine Symptoms Reported: Not assessed    Gastrointestinal Gastrointestinal Symptoms Reported: Not assessed      Genitourinary Genitourinary Symptoms Reported: Not assessed    Integumentary Integumentary Symptoms Reported: Not assessed    Musculoskeletal Musculoskelatal Symptoms Reviewed: Not assessed        Psychosocial Psychosocial Symptoms Reported: No symptoms reported Behavioral Management Strategies: Coping strategies, Counseling          10/21/2023    9:00 AM  Depression screen PHQ 2/9  Decreased Interest 0  Down, Depressed, Hopeless 0  PHQ - 2 Score 0  Altered sleeping 0  Tired, decreased energy 0  Change in appetite 0  Feeling bad or failure about yourself  0  Trouble concentrating 0  Moving slowly or fidgety/restless 0  Suicidal thoughts 0  PHQ-9 Score 0    There were no vitals filed for this visit.  Medications Reviewed Today     Reviewed by Joell Buerger D, LCSW (Social Worker) on 11/11/23 at 0813  Med List Status: <None>   Medication Order  Taking? Sig Documenting Provider Last Dose Status Informant  albuterol  (VENTOLIN  HFA) 108 (90 Base) MCG/ACT inhaler [id number]  Inhale 2 puffs into the lungs every 6 (six) hours as needed for wheezing or shortness of breath.  Patient not taking: Reported on 10/21/2023   Delores Suzann HERO, MD  Active   fluticasone  (FLONASE ) 50 MCG/ACT nasal spray [id number]  Place 2 sprays into both nostrils daily.  Patient not taking: Reported on 10/21/2023   Delores Suzann HERO, MD  Active             Recommendation:   Continue Current Plan of Care  Follow Up Plan:   Telephone follow-up in 1 month  Rolin Kerns, LCSW [facility]  [facility], [facility] Clinical Social Worker Direct Dial: [phone]  Fax: [phone] Website: [email or web address] 8:17 AM

## 2023-11-11 NOTE — Patient Instructions (Addendum)
 Visit Information  Amy Petersen was given information about Medicaid Managed Care team care coordination services as a part of their Healthy [facility] Medicaid benefit. Amy Petersen verbally consented to engagement with the [facility] Managed Care team.   If you are experiencing a medical emergency, please call 911 or report to your local emergency department or urgent care.   If you have a non-emergency medical problem during routine business hours, please contact your provider's office and ask to speak with a nurse.   For questions related to your Healthy [facility] health plan, please call: [phone] or visit the homepage here: [email or web address]  If you would like to schedule transportation through your Healthy [facility] plan, please call the following number at least 2 days in advance of your appointment: [phone]  For information about your ride after you set it up, call Ride Assist at [phone]. Use this number to activate a Will Call pickup, or if your transportation is late for a scheduled pickup. Use this number, too, if you need to make a change or cancel a previously scheduled reservation.  If you need transportation services right away, call [phone]. The after-hours call center is staffed 24 hours to handle ride assistance and urgent reservation requests (including discharges) 365 days a year. Urgent trips include sick visits, hospital discharge requests and life-sustaining treatment.  Call the [facility] Line at [phone], at any time, 24 hours a day, 7 days a week. If you are in danger or need immediate medical attention call 911.  If you would like help to quit smoking, call 1-800-QUIT-NOW (205-605-9991) OR Espaol: 1-855-Djelo-Ya (8-144-664-6430) o para ms informacin haga clic aqu or Text READY to 799-599 to register via text  Amy Petersen - following are the goals we discussed in your visit today:   Goals  Addressed             This Visit's Progress    LCSW VBCI Social Work Care Plan   On track    Problems:   Limited social support  CSW Clinical Goal(s):   Over the next 60 days the Patient will attend all scheduled medical appointments as evidenced by patient report and care team review of appointment completion in electronic MEDICAL RECORD NUMBER  work with Child psychotherapist to address concerns related to strengthening support in the community.  Interventions:  Mental Health:  Evaluation of current treatment plan related to Stress Active listening / Reflection utilized Financial risk analyst / information provided Emotional Support Provided Provided general psycho-education for mental health needs Solution-Focued Strategies employed:   Patient Goals/Self-Care Activities:  Increase coping skills and healthy habits Continue to participate in counseling to assist with symptom management  Plan:   Telephone follow up appointment with care management team member scheduled for:  4 weeks        Please see education materials related to topics discussed provided by MyChart link.  Patient verbalizes understanding of instructions and care plan provided today and agrees to view in MyChart. Active MyChart status and patient understanding of how to access instructions and care plan via MyChart confirmed with patient.     Licensed Clinical Social Worker will f/up on 8/19 at 3:30 PM  Rolin Kerns, LCSW [place]  [facility], [facility] Clinical Social Worker Direct Dial: [phone]  Fax: [phone] Website: [email or web address] 8:18 AM   Following is a copy of your plan of care:  There are no care plans that you recently modified to display for this patient.

## 2023-12-08 ENCOUNTER — Telehealth: Payer: Self-pay | Admitting: Licensed Clinical Social Worker

## 2024-01-05 ENCOUNTER — Telehealth: Payer: Self-pay | Admitting: Licensed Clinical Social Worker

## 2024-02-02 ENCOUNTER — Encounter: Payer: Self-pay | Admitting: Licensed Clinical Social Worker

## 2024-02-02 ENCOUNTER — Telehealth: Payer: Self-pay | Admitting: Licensed Clinical Social Worker

## 2024-02-02 NOTE — Patient Instructions (Signed)
 Amy Petersen - I am sorry I was unable to reach you today for our scheduled appointment. I work with Delores Suzann HERO, MD and am calling to support your healthcare needs. Please contact me at [phone] at your earliest convenience, if you have any new concerns.   Thank you,  Rolin Kerns, LCSW [place]  [facility], [facility] Clinical Social Worker Direct Dial: [phone]  Fax: [phone] Website: [email or web address] 1:26 PM

## 2024-02-25 ENCOUNTER — Ambulatory Visit: Payer: Self-pay

## 2024-02-25 NOTE — Telephone Encounter (Signed)
 Copied from CRM #[id number]. Topic: Clinical - Red Word Triage >> Feb 25, 2024 10:03 AM [name] wrote: Red Word that prompted transfer to Nurse Triage: shortness of breath   PAS transferred call to NT: no pt on line: nurse will attempt to reach back out to pt. Answer Assessment - Initial Assessment Questions 1. RESPIRATORY STATUS: Describe your breathing? (e.g., wheezing, shortness of breath, unable to speak, severe coughing)      SOB - has rescue inhaler but do not like to use inhaler, wheezing 2. ONSET: When did this breathing problem begin?      X 2 weeks 3. PATTERN Does the difficult breathing come and go, or has it been constant since it started?      Comes and goes - about 2 - 3 times / per day or more 4. SEVERITY: How bad is your breathing? (e.g., mild, moderate, severe)      moderate 5. RECURRENT SYMPTOM: Have you had difficulty breathing before? If Yes, ask: When was the last time? and What happened that time?      yes 6. CARDIAC HISTORY: Do you have any history of heart disease? (e.g., heart attack, angina, bypass surgery, angioplasty)      unsure 7. LUNG HISTORY: Do you have any history of lung disease?  (e.g., pulmonary embolus, asthma, emphysema) unsure 8. CAUSE: What do you think is causing the breathing problem?      unsure 9. OTHER SYMPTOMS: Do you have any other symptoms? (e.g., chest pain, cough, dizziness, fever, runny nose)     no 10. O2 SATURATION MONITOR:  Do you use an oxygen saturation monitor (pulse oximeter) at home? If Yes, ask: What is your reading (oxygen level) today? What is your usual oxygen saturation reading? (e.g., 95%)       Unsure at present at time 11. PREGNANCY: Is there any chance you are pregnant? When was your last menstrual period?       no 12. TRAVEL: Have you traveled out of the country in the last month? (e.g., travel history, exposures)       no  Protocols used: Breathing Difficulty-A-AH

## 2024-02-25 NOTE — Telephone Encounter (Signed)
 Pt states was awaiting call back from [facility] office. This RN contacted via CAL line and pt was scheduled at the time requested by office.

## 2024-02-25 NOTE — Telephone Encounter (Signed)
 Noted.  Scheduled to see Dr. Geronimo on 11/7.  Nothing further needed.

## 2024-02-26 ENCOUNTER — Ambulatory Visit (INDEPENDENT_AMBULATORY_CARE_PROVIDER_SITE_OTHER)

## 2024-02-26 ENCOUNTER — Encounter: Payer: Self-pay | Admitting: Internal Medicine

## 2024-02-26 ENCOUNTER — Ambulatory Visit: Admitting: Internal Medicine

## 2024-02-26 VITALS — BP 116/68 | HR 65 | Temp 98.3°F | Ht 63.0 in | Wt 140.4 lb

## 2024-02-26 DIAGNOSIS — Z8709 Personal history of other diseases of the respiratory system: Secondary | ICD-10-CM | POA: Diagnosis not present

## 2024-02-26 DIAGNOSIS — R0600 Dyspnea, unspecified: Secondary | ICD-10-CM | POA: Diagnosis not present

## 2024-02-26 DIAGNOSIS — R062 Wheezing: Secondary | ICD-10-CM | POA: Diagnosis not present

## 2024-02-26 DIAGNOSIS — R7689 Other specified abnormal immunological findings in serum: Secondary | ICD-10-CM

## 2024-02-26 DIAGNOSIS — R0602 Shortness of breath: Secondary | ICD-10-CM

## 2024-02-26 LAB — CBC WITH DIFFERENTIAL/PLATELET
Basophils Absolute: 0 K/uL (ref 0.0–0.1)
Basophils Relative: 0.6 % (ref 0.0–3.0)
Eosinophils Absolute: 0.2 K/uL (ref 0.0–0.7)
Eosinophils Relative: 3.5 % (ref 0.0–5.0)
HCT: 38.1 % (ref 36.0–46.0)
Hemoglobin: 13 g/dL (ref 12.0–15.0)
Lymphocytes Relative: 44.7 % (ref 12.0–46.0)
Lymphs Abs: 2.4 K/uL (ref 0.7–4.0)
MCHC: 34 g/dL (ref 30.0–36.0)
MCV: 81.7 fl (ref 78.0–100.0)
Monocytes Absolute: 0.4 K/uL (ref 0.1–1.0)
Monocytes Relative: 6.8 % (ref 3.0–12.0)
Neutro Abs: 2.3 K/uL (ref 1.4–7.7)
Neutrophils Relative %: 44.4 % (ref 43.0–77.0)
Platelets: 336 K/uL (ref 150.0–400.0)
RBC: 4.66 Mil/uL (ref 3.87–5.11)
RDW: 13.2 % (ref 11.5–15.5)
WBC: 5.3 K/uL (ref 4.0–10.5)

## 2024-02-26 NOTE — Patient Instructions (Addendum)
 ICD-10-CM   1. History of asthma  Z87.09     2. Elevated IgE level  R76.89     3. Shortness of breath  R06.02     4. Wheezing  R06.2      COncerns is uncontrolled asthma  Plan  - try trelegy sample 1 puff daily     - see if you can accept mild side effects -hold off on prednisone  burst based on shared decision making  - do blod work cbc with diff, RAST allergy panel - do cxr - do full PFT  Followup - NP in 4-6 weeks but after above

## 2024-02-26 NOTE — Progress Notes (Signed)
 s: Referred for dyspnea by Delores Suzann HERO, MD  Subjective:   PATIENT ID: Amy Petersen GENDER: female DOB: Oct 25, 2000, MRN: [id number]  Chief Complaint  Patient presents with   Pulmonary Consult    Self referral. Pt c/o for the past 2-3 years. She is SOB with or without exertion- comes and goes without any specific trigger. She states her chest feels congested.    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 She did get covid-19 but was mild infection.   Episodic dyspnea, chest tightness over last couple of years. The albuterol  inhaler helps. She says she is using flovent  daily 3-4 puffs every night. Doesn't rinse mouth after use. She says she got it over the counter. She does think the flovent  has been helpful. She doesn't have much of a cough.   No sinus congestion, PND, gerd.  Otherwise pertinent review of systems is negative.  No family history of lung disease.  She is in school studying IT. 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 Never smoker, MJ, vaping    OV 02/26/2024  Subjective:  Patient ID: Amy Petersen, female , DOB: 2000-11-29 , age 89 y.o. , MRN: [id number] , ADDRESS: [address] [place] [address] PCP Delores Suzann HERO, MD Patient Care Team: Delores Suzann HERO, MD as PCP - General (Family Medicine)  This Provider for this visit: Treatment Team:  Attending Provider: Geronimo Amel, MD    02/26/2024 -   Chief Complaint  Patient presents with   Acute Visit    Pt states she has been having breathing issues for a couple of years now,  Past 2 weeks she has been episodes of SOB SOB occurs w/ any activity pt does     Shortness of Breath     HPI Amy Petersen 22 y.o. -Amy Petersen is a 23 year old female who presents with worsening shortness of breath.seen bn Nate MEier in 2023. Did not tolerate symbicort . [employer] from [place]. Graduated from  [employer]. Work in IT now  She has been experiencing progressively worsening shortness of breath over the past two weeks. Typically, she experiences shortness of breath two to three times a week, but recently it has occurred daily and multiple times a day, even at rest. This has caused significant concern for her. No cough, but she mentions intermittent wheezing. Her symptoms have woken her up twice in the past week.  She has been using albuterol  for symptom relief, which helps, but she is seeking further evaluation to understand the underlying cause of her symptoms. She has a history of elevated IgE levels, but specific allergens have not been identified. She recalls using a strong inhaler in the past, possibly Symbicort , which caused throat dryness and did not alleviate her symptoms.  Her past medical history includes a negative asthma test, although asthma was mentioned as a possibility. She has a history of acute bronchitis and tonsillitis, for which she underwent surgery as a child. She has experienced breathing issues since childhood.  No fever, chills, nausea, vomiting, diarrhea, and she is not pregnant. She has previously been to the emergency room multiple times due to her breathing issues.  She wants more testing  She is hesitatnt about taking MDI outside of albuterol  but we discussed this at length and she will give trelegy  a try  Denies she could be pregnant      LAB RESULTS last 96  hours No results found.       has a past medical history of Malaria, Recurrent UTI, and Typhoid.   reports that she has never smoked. She has never used smokeless tobacco.  Past Surgical History:  Procedure Laterality Date   THROAT SURGERY      No Known Allergies  Immunization History  Administered Date(s) Administered   HPV 9-valent 01/27/2019, 02/02/2020, 05/23/2021   Influenza, Seasonal, Injecte, Preservative Fre 07/10/2023   Influenza,inj,Quad PF,6+ Mos 02/22/2019    Influenza-Unspecified 01/27/2019, 01/19/2021   Meningococcal B, Unspecified 01/27/2019, 02/02/2020   PFIZER(Purple Top)SARS-COV-2 Vaccination 08/26/2019, 09/14/2019   Tdap 01/27/2019    No family history on file.   Current Outpatient Medications:    albuterol  (VENTOLIN  HFA) 108 (90 Base) MCG/ACT inhaler, Inhale 2 puffs into the lungs every 6 (six) hours as needed for wheezing or shortness of breath., Disp: 18 g, Rfl: 6   fluticasone  (FLONASE ) 50 MCG/ACT nasal spray, Place 2 sprays into both nostrils daily. (Patient not taking: Reported on 02/26/2024), Disp: 16 g, Rfl: 6      Objective:   Vitals:   02/26/24 1011  BP: 116/68  Pulse: 65  Temp: 98.3 F (36.8 C)  TempSrc: Oral  SpO2: 100%  Weight: 140 lb 6.4 oz (63.7 kg)  Height: 5' 3 (1.6 m)    Estimated body mass index is 24.87 kg/m as calculated from the following:   Height as of this encounter: 5' 3 (1.6 m).   Weight as of this encounter: 140 lb 6.4 oz (63.7 kg).  @WEIGHTCHANGE @  [employer]   02/26/24 1011  Weight: 140 lb 6.4 oz (63.7 kg)     Physical Exam   General: No distress. Loos well O2 at rest: no Cane present: no Sitting in wheel chair: no Frail: no Obese: no Neuro: Alert and Oriented x 3. GCS 15. Speech normal Psych: Pleasant Resp:  Barrel Chest - no.  Wheeze - n, Crackles - o, No overt respiratory distress CVS: Normal heart sounds. Murmurs - no Ext: Stigmata of Connective Tissue Disease - no HEENT: Normal upper airway. PEERL +. No post nasal drip        Assessment/     Assessment & Plan History of asthma  Elevated IgE level  Shortness of breath  Wheezing    PLAN Patient Instructions     ICD-10-CM   1. History of asthma  Z87.09     2. Elevated IgE level  R76.89     3. Shortness of breath  R06.02     4. Wheezing  R06.2      COncerns is uncontrolled asthma  Plan  - try trelegy sample 1 puff daily     - see if you can accept mild side effects -hold off on prednisone   burst based on shared decision making  - do blod work cbc with diff, RAST allergy panel - do cxr - do full PFT  Followup - NP in 4-6 weeks but after above    FOLLOWUP    Return for - NP in 4-6 weeks but after tests.    SIGNATURE    Dr. Dorethia Cave, M.D., F.C.C.P,  Pulmonary and Critical Care Medicine Staff Physician, [facility] Center Director - Interstitial Lung Disease  Program  Pulmonary Fibrosis [facility] at [facility] [place], [place], [address]  Pager: [phone], If no answer or between  15:00h - 7:00h: call [phone] Telephone: [phone]  10:50 AM 02/26/2024

## 2024-02-29 ENCOUNTER — Ambulatory Visit: Payer: Self-pay | Admitting: Internal Medicine

## 2024-02-29 ENCOUNTER — Ambulatory Visit: Admitting: Primary Care

## 2024-02-29 ENCOUNTER — Encounter: Payer: Self-pay | Admitting: Primary Care

## 2024-02-29 VITALS — BP 122/68 | HR 71 | Temp 98.1°F | Ht 63.0 in | Wt 143.8 lb

## 2024-02-29 DIAGNOSIS — R002 Palpitations: Secondary | ICD-10-CM | POA: Diagnosis not present

## 2024-02-29 DIAGNOSIS — R0602 Shortness of breath: Secondary | ICD-10-CM | POA: Diagnosis not present

## 2024-02-29 DIAGNOSIS — J4520 Mild intermittent asthma, uncomplicated: Secondary | ICD-10-CM | POA: Diagnosis not present

## 2024-02-29 NOTE — Patient Instructions (Addendum)
  VISIT SUMMARY: Today, you were seen for shortness of breath and wheezing, which you experienced at work. You used your albuterol  inhaler twice, which led to dizziness and a rapid heart rate. You have a history of asthma and are currently using a Trelegy inhaler. We discussed the possibility of your symptoms being related to asthma, anxiety, or a cardiac issue. A chest x-ray was normal, and we are awaiting further test results.  YOUR PLAN: -SHORTNESS OF BREATH AND WHEEZING, POSSIBLE ASTHMA: Your shortness of breath and wheezing are likely related to asthma, which is a condition that causes your airways to narrow and swell, making it hard to breathe. Continue using your Trelegy inhaler as prescribed and rinse your mouth after each use to prevent throat dryness. Use your albuterol  inhaler only in emergencies. We are waiting for the results of your allergy panel and breathing tests. We have also ordered a D-dimer test to rule out a blood clot and an EKG to check your heart rhythm. Additionally, you will wear a zio patch monitor for 7 days to monitor your heart.  -PALPITATIONS AND DIZZINESS UNDER CARDIAC EVALUATION: Your episodes of palpitations and dizziness may be related to the use of your albuterol  inhaler or anxiety, but we need to rule out any heart-related issues. An EKG and a Zio patch heart monitor will help us  check for any irregular heart rhythms. If we find any abnormalities, we may refer you to a cardiologist.  -POSSIBLE ANXIETY CONTRIBUTING TO SYMPTOMS: Anxiety might be contributing to your symptoms of shortness of breath, palpitations, and dizziness. Anxiety can sometimes cause physical symptoms that overlap with asthma and heart issues.  -MONITORING FOR POSSIBLE BLOOD CLOT DUE TO CONTRACEPTIVE IMPLANT: Your contraceptive implant (Implanon ) can increase the risk of blood clots, which might contribute to your shortness of breath. We have ordered a D-dimer test to rule out the possibility of a  blood clot.  INSTRUCTIONS: Please continue using your Trelegy inhaler as prescribed and rinse your mouth after each use. Use your albuterol  inhaler only in emergencies. We will contact you with the results of your allergy panel, breathing tests, D-dimer test, EKG, and Holter monitor. If you experience any worsening symptoms, please seek medical attention immediately.  Follow-up Please schedule follow-up after PFT with Dr. Geronimo

## 2024-02-29 NOTE — Telephone Encounter (Signed)
 FYI Only or Action Required?: FYI only for provider: appointment scheduled on today.  Patient is followed in Pulmonology for Asthma, last seen on 02/26/2024 by Geronimo Amel, MD.  Called Nurse Triage reporting Shortness of Breath.  Symptoms began today.  Interventions attempted: Rescue inhaler and Maintenance inhaler.  Symptoms are: rapidly improving.  Triage Disposition: See HCP Within 4 Hours (Or PCP Triage)  Patient/caregiver understands and will follow disposition?: Yes     Copied from CRM [id number]. Topic: Clinical - Red Word Triage >> Feb 29, 2024 12:57 PM Nathanel DEL wrote: Red Word that prompted transfer to Nurse Triage: this am pt was at work.  She ran out of breath, she was dizzy , could not breath, used inhalers as much as she could, doesn't remember a lot about.  They have a nurse at her work who talked her out of the episode, spoke to her mom as well.  Brother has picked her up now.  What should she do? Just saw Dr Geronimo 1st time last Friday    E2C2 Pulmonary Triage - Initial Assessment Questions "Chief Complaint (e.g., cough, sob, wheezing, fever, chills, sweat or additional symptoms) *Go to specific symptom protocol after initial questions. Shortness of breath   "How long have symptoms been present?" Began today, greatly improved at this time  Have you tested for COVID or Flu? Note: If not, ask patient if a home test can be taken. If so, instruct patient to call back for positive results. No  MEDICINES:   "Have you used any OTC meds to help with symptoms?" No If yes, ask "What medications?" -  "Have you used your inhalers/maintenance medication?" Yes If yes, "What medications?" Trelegy inhaler, Albuterol  inhaler   OXYGEN: "Do you wear supplemental oxygen?" No If yes, "How many liters are you supposed to use?" -  "Do you monitor your oxygen levels?" No If yes, What is your reading (oxygen level) today? -  What is your usual oxygen saturation  reading?  (Note: Pulmonary O2 sats should be 90% or greater) -     Reason for Disposition  [1] Longstanding difficulty breathing (e.g., CHF, COPD, emphysema) AND [2] WORSE than normal  Answer Assessment - Initial Assessment Questions 1. RESPIRATORY STATUS: Describe your breathing? (e.g., wheezing, shortness of breath, unable to speak, severe coughing)      Shortness of breath 2. ONSET: When did this breathing problem begin?      Today at work  3. PATTERN Does the difficult breathing come and go, or has it been constant since it started?      Resolved  4. SEVERITY: How bad is your breathing? (e.g., mild, moderate, severe)      Moderate  5. RECURRENT SYMPTOM: Have you had difficulty breathing before? If Yes, ask: When was the last time? and What happened that time?      Yes, history of asthma  6. CARDIAC HISTORY: Do you have any history of heart disease? (e.g., heart attack, angina, bypass surgery, angioplasty)      No 7. LUNG HISTORY: Do you have any history of lung disease?  (e.g., pulmonary embolus, asthma, emphysema)     Asthma  8. CAUSE: What do you think is causing the breathing problem?      Asthma  9. OTHER SYMPTOMS: Do you have any other symptoms? (e.g., chest pain, cough, dizziness, fever, runny nose)     Dizziness that is now resolved  10. O2 SATURATION MONITOR:  Do you use an oxygen saturation monitor (pulse oximeter) at  home? If Yes, ask: What is your reading (oxygen level) today? What is your usual oxygen saturation reading? (e.g., 95%)       No  Protocols used: Breathing Difficulty-A-AH

## 2024-02-29 NOTE — Progress Notes (Unsigned)
 @Patient  ID: Amy Petersen, female    DOB: [birth year], 23 y.o.   MRN: [id number]  Chief Complaint  Patient presents with   Shortness of Breath    Worsening sob- pt states she was sitting down when she got an episode where she felt dizzy and she was out of breath and had to gasp for air.     Referring provider: Delores Suzann HERO, MD  HPI: 23 year old, never smoked. 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 Patient of Dr. Geronimo, self referred for dyspnea.    02/29/2024 Discussed the use of AI scribe software for clinical note transcription with the patient, who gave verbal consent to proceed.  History of Present Illness Amy Petersen is a 23 year old female with asthma who presents with shortness of breath and wheezing. Patient was seen by our office on 02/26/24. She experienced episode of shortness of breath and wheezing while at work today, prompting the use of her albuterol  rescue inhaler twice. This was followed by dizziness and a rapid heart rate. The dizziness was described as a tight feeling in her head rather than a sensation of falling, and she also experienced a headache.  She has a history of asthma; her primary care doctor and a specialist told her she might have mild persistent asthma, although her test results were normal. She uses a Trelegy inhaler, which she reports causes throat dryness, and she recalls being told this could be a side effect. She has not used prednisone  before.  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 She is not exposed to chemicals at work and does not have pets.  She is currently using an Implanon  implant for birth control. No cough or chest tightness recently.   No Known Allergies  Immunization History  Administered Date(s) Administered   HPV 9-valent 01/27/2019, 02/02/2020, 05/23/2021   Influenza,  Seasonal, Injecte, Preservative Fre 07/10/2023   Influenza,inj,Quad PF,6+ Mos 02/22/2019   Influenza-Unspecified 01/27/2019, 01/19/2021   Meningococcal B, Unspecified 01/27/2019, 02/02/2020   PFIZER(Purple Top)SARS-COV-2 Vaccination 08/26/2019, 09/14/2019   Tdap 01/27/2019    Past Medical History:  Diagnosis Date   Malaria    Recurrent UTI    Typhoid     Tobacco History: Social History   Tobacco Use  Smoking Status Never  Smokeless Tobacco Never   Counseling given: Not Answered   Outpatient Medications Prior to Visit  Medication Sig Dispense Refill   albuterol  (VENTOLIN  HFA) 108 (90 Base) MCG/ACT inhaler Inhale 2 puffs into the lungs every 6 (six) hours as needed for wheezing or shortness of breath. 18 g 6   fluticasone  (FLONASE ) 50 MCG/ACT nasal spray Place 2 sprays into both nostrils daily. (Patient not taking: Reported on 02/29/2024) 16 g 6   No facility-administered medications prior to visit.   Review of Systems  Review of Systems  Respiratory:  Positive for chest tightness and shortness of breath.   Cardiovascular:  Positive for palpitations.   Physical Exam  BP 122/68   Pulse 71   Temp 98.1 F (36.7 C)   Ht 5' 3 (1.6 m)   Wt 143 lb 12.8 oz (65.2 kg)   LMP 02/05/2024 (Approximate)   SpO2 99% Comment: ra  BMI 25.47 kg/m  Physical Exam Constitutional:      Appearance: Normal appearance. She is well-developed.  HENT:     Head: Normocephalic and atraumatic.  Mouth/Throat:     Mouth: Mucous membranes are moist.     Pharynx: Oropharynx is clear.  Eyes:     Pupils: Pupils are equal, round, and reactive to light.  Cardiovascular:     Rate and Rhythm: Normal rate and regular rhythm.     Heart sounds: Normal heart sounds. No murmur heard. Pulmonary:     Effort: Pulmonary effort is normal. No respiratory distress.     Breath sounds: Normal breath sounds. No wheezing or rhonchi.  Musculoskeletal:        General: Normal range of motion.     Cervical  back: Normal range of motion and neck supple.  Skin:    General: Skin is warm and dry.     Findings: No erythema or rash.  Neurological:     General: No focal deficit present.     Mental Status: She is alert and oriented to person, place, and time. Mental status is at baseline.  Psychiatric:        Mood and Affect: Mood normal.        Behavior: Behavior normal.        Thought Content: Thought content normal.        Judgment: Judgment normal.     Lab Results:  CBC    Component Value Date/Time   WBC 5.3 02/26/2024 1134   RBC 4.66 02/26/2024 1134   HGB 13.0 02/26/2024 1134   HGB 13.3 09/18/2023 0929   HCT 38.1 02/26/2024 1134   HCT 41.0 09/18/2023 0929   PLT 336.0 02/26/2024 1134   PLT 343 09/18/2023 0929   MCV 81.7 02/26/2024 1134   MCV 85 09/18/2023 0929   MCH 27.7 09/18/2023 0929   MCH 27.9 01/11/2022 0411   MCHC 34.0 02/26/2024 1134   RDW 13.2 02/26/2024 1134   RDW 13.3 09/18/2023 0929   LYMPHSABS 2.4 02/26/2024 1134   LYMPHSABS 2.7 02/22/2019 1641   MONOABS 0.4 02/26/2024 1134   EOSABS 0.2 02/26/2024 1134   EOSABS 0.2 02/22/2019 1641   BASOSABS 0.0 02/26/2024 1134   BASOSABS 0.0 02/22/2019 1641    BMET    Component Value Date/Time   NA 140 01/11/2022 0411   NA 134 02/22/2019 1641   K 3.6 01/11/2022 0411   CL 106 01/11/2022 0411   CO2 23 01/11/2022 0411   GLUCOSE 121 (H) 01/11/2022 0411   BUN 13 01/11/2022 0411   BUN 15 02/22/2019 1641   CREATININE 0.89 01/11/2022 0411   CALCIUM 10.0 01/11/2022 0411   GFRNONAA >60 01/11/2022 0411   GFRAA CANCELED 02/22/2019 1641    BNP No results found for: BNP  ProBNP No results found for: PROBNP  Imaging: No results found.   Assessment & Plan:    1. Shortness of breath (Primary) - D-Dimer, Quantitative; Future - Ambulatory referral to Cardiology - LONG TERM MONITOR XT (3-14 DAYS); Future - D-Dimer, Quantitative  2. Palpitations - Ambulatory referral to Cardiology - EKG 12-Lead - LONG TERM  MONITOR XT (3-14 DAYS); Future  3. Intermittent asthma without complication, unspecified asthma severity  Assessment and Plan Assessment & Plan Shortness of breath and wheezing, possible asthma Intermittent shortness of breath and wheezing with a working diagnosis of asthma. Symptoms may be exacerbated by albuterol  use, which can cause palpitations and jitteriness. Differential diagnosis includes anxiety and possible cardiac issues. Chest x-ray appears normal, and lab results are pending. Trelegy inhaler causes throat dryness but is continued as prescribed. - Continue Trelegy inhaler, rinse mouth after use - Use albuterol   sparingly 2 puffs every 4-6 hours, only in emergencies - Await results of allergy panel and breathing tests - Ordered D-dimer test to rule out blood clot - Ordered EKG to assess heart rhythm - Ordered zio patch for prolonged cardiac monitoring  Palpitations  Episodes of palpitations and dizziness, possibly related to albuterol  use or anxiety. Differential includes cardiac arrhythmia. Previous cardiac evaluation was inconclusive. EKG and Holter monitor will help assess for arrhythmias. - EKG today if office showed normal sinus rhythm  - Ordered zio patch for 7 days  - Will consider referral to cardiologist if cardiac monitor shows abnormalities  Possible anxiety Anxiety may contribute to symptoms of shortness of breath, palpitations, and dizziness. Symptoms may overlap with asthma and cardiac issues.  Contraceptive use Contraceptive implant (Implanon ) increases risk for blood clots, which could contribute to shortness of breath. D-dimer test will help rule out blood clot. - Ordered D-dimer test to rule out blood clot  I personally spent a total of 40 minutes in the care of the patient today including counseling and educating, placing orders, documenting clinical information in the EHR, independently interpreting results, and coordinating care  Almarie LELON Ferrari,  NP 03/01/2024

## 2024-03-01 LAB — ALLERGEN PROFILE, PERENNIAL ALLERGEN IGE
Alternaria Alternata IgE: <0.1 kU/L
Aspergillus Fumigatus IgE: 0.13 kU/L — AB
Aureobasidi Pullulans IgE: <0.1 kU/L
Candida Albicans IgE: 0.39 kU/L — AB
Cat Dander IgE: <0.1 kU/L
Chicken Feathers IgE: 0.24 kU/L — AB
Cladosporium Herbarum IgE: 0.22 kU/L — AB
Cow Dander IgE: 0.19 kU/L — AB
D Farinae IgE: 82.9 kU/L — AB
D Pteronyssinus IgE: 57.9 kU/L — AB
Dog Dander IgE: 0.35 kU/L — AB
Duck Feathers IgE: <0.1 kU/L
Goose Feathers IgE: <0.1 kU/L
Mouse Urine IgE: <0.1 kU/L
Mucor Racemosus IgE: <0.1 kU/L
Penicillium Chrysogen IgE: <0.1 kU/L
Phoma Betae IgE: 0.19 kU/L — AB
Setomelanomma Rostrat: 0.18 kU/L — AB
Stemphylium Herbarum IgE: 0.45 kU/L — AB

## 2024-03-01 LAB — D-DIMER, QUANTITATIVE: D-Dimer, Quant: 0.44 ug{FEU}/mL (ref <0.50)

## 2024-03-01 NOTE — Telephone Encounter (Signed)
 Tons of allregies but most important is dust mite   Latest Reference Range & Units 02/26/24 11:34  D Pteronyssinus IgE Class V kU/L 57.90 !  D Farinae IgE Class V kU/L 82.90 !  Cat Dander IgE Class 0 kU/L <0.10  Dog Dander IgE Class I kU/L 0.35 !  Penicillium Chrysogen IgE Class 0 kU/L <0.10  Cladosporium Herbarum IgE Class 0/I kU/L 0.22 !  Aspergillus Fumigatus IgE Class 0/I kU/L 0.13 !  Mucor Racemosus IgE Class 0 kU/L <0.10  Alternaria Alternata IgE Class 0 kU/L <0.10  Stemphylium Herbarum IgE Class I kU/L 0.45 !  Goose Feathers IgE Class 0 kU/L <0.10  Chicken Feathers IgE Class 0/I kU/L 0.24 !  Duck Feathers IgE Class 0 kU/L <0.10  Class Description Allergens  Comment  Mouse Urine IgE Class 0 kU/L <0.10  !: Data is abnormal  Plan  - go through all folowup test  But do following for dust mite - ask her to google all this or send it in a letter.      Google Dust Mite Allergy and get info from Asthma and Allergy [employer] and pillows in zippered dust-proof covers. These covers are made of a material with pores too small to let dust mites and their waste product through. They are also called allergen-impermeable. Plastic or vinyl covers are the least expensive, but some people find them uncomfortable. You can buy other fabric allergen-impermeable covers from many regular bedding stores.   Wash your sheets and blankets weekly in hot water. You have to wash them in water that's at least 130 F or more to kill dust mites.  Get rid of all types of fabric that mites love and that you cannot easily wash regularly in hot water.   Avoid wall-to-wall carpeting, curtains, blinds, upholstered furniture and down-filled covers and pillows in the bedroom. Put roll-type shades on your windows instead of curtains.  Have someone without a dust mite allergy clean your bedroom. If this is not possible, wear a filtering mask when dusting or vacuuming. Many drug stores  carry these items. Dusting and vacuuming stir up dust. So try to do these chores when you can stay out of the bedroom for a while afterward.   Special CERTIFIED filter vacuum cleaners can help to keep mites and mite waste from getting back into the air. CERTIFIED vacuums are scientifically tested and verified as more suitable for making your home healthier.  Vacuuming is not enough to remove all dust mites and their waste. A large amount of the dust mite population may remain because they live deep inside the stuffing of sofas, chairs, mattresses, pillows, and carpeting.  Treat other rooms in your house like your bedroom. Here are more tips:  Avoid wall-to-wall carpeting, if possible. If you do use carpeting, mites don't like the type with a short, tight pile as much. Use washable throw rugs over regularly damp-mopped wood, linoleum or tiled floors. Wash rugs in hot water whenever possible. Cold water isn't as effective. Dry cleaning kills all dust mites and is also good for removing dust mites from living in fabrics. Keep the humidity in your home less than 50%. Use a dehumidifier and/or air conditioner to do this. Use a CERTIFIED filter with your central furnace and air conditioning unit. This can help trap dust mites from your entire home. Freestanding air cleaners only filter air in a limited area. Avoid devices that treat air with heat, electrostatic ions, or  ozon

## 2024-03-07 NOTE — Progress Notes (Unsigned)
 Cardiology Office Note:  .   Date:  03/09/2024  ID:  Amy Petersen, DOB 2000/10/27, MRN [id number] PCP: Delores Suzann HERO, MD  [facility] Providers Cardiologist:  None    History of Present Illness: .    Chief Complaint  Patient presents with   Shortness of Breath    Amy Petersen is a 23 y.o. female with history of asthma who presents for the evaluation of palpitations at the request of Delores Suzann HERO, MD.   History of Present Illness   Amy Petersen is a 23 year old female who presents with palpitations and shortness of breath.  She has been experiencing episodes of palpitations and heart racing for the past four years, often accompanied by shortness of breath. A recent episode occurred last week at work, where she felt dizzy and possibly lost consciousness briefly. During this episode, she also experienced heart racing and used her inhaler twice, which provided temporary relief.  She has a history of respiratory issues since childhood, including bronchitis, and has been using an inhaler for several years. Despite not having a formal diagnosis of asthma, she has been treated as if she has asthma due to her symptoms. She mentions elevated IgE levels, and no specific allergens have been identified yet.  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 She does not smoke, uses alcohol occasionally, and has no history of drug use. She is physically active, having played soccer throughout college.  There is no family history of heart disease, and both her parents are alive, although they have high blood pressure.  During the review of symptoms, she denies having chest pain, heart attacks, strokes, diabetes, or high blood pressure.   No CP or SOB or palpitations outside of these episodes.          Problem List Asthma    ROS: All other ROS reviewed and negative. Pertinent positives noted in the HPI.     Studies Reviewed:  SABRA   EKG Interpretation Date/Time:  Wednesday March 09 2024 10:24:39 EST Ventricular Rate:  59 PR Interval:  132 QRS Duration:  82 QT Interval:  402 QTC Calculation: 397 R Axis:   78  Text Interpretation: Sinus bradycardia with sinus arrhythmia Confirmed by Barbaraann Kotyk [phone]) on 03/09/2024 10:26:59 AM    Physical Exam:   VS:  BP 116/80   Pulse 72   Ht 5' 3 (1.6 m)   Wt 140 lb (63.5 kg)   LMP 02/05/2024 (Approximate)   SpO2 98%   BMI 24.80 kg/m    Wt Readings from Last 3 Encounters:  03/09/24 140 lb (63.5 kg)  02/29/24 143 lb 12.8 oz (65.2 kg)  02/26/24 140 lb 6.4 oz (63.7 kg)    GEN: Well nourished, well developed in no acute distress NECK: No JVD; No carotid bruits CARDIAC: RRR, no murmurs, rubs, gallops RESPIRATORY:  Clear to auscultation without rales, wheezing or rhonchi  ABDOMEN: Soft, non-tender, non-distended EXTREMITIES:  No edema; No deformity  ASSESSMENT AND PLAN: .   Assessment and Plan    Palpitations and episodic shortness of breath Intermittent palpitations and shortness of breath with recent dizziness and heart racing. Differential includes asthma-related episodes versus cardiac pathology. Elevated IgE suggests allergic component. Recent episode with possible syncope, no chest pain. EKG unavailable for review. - Ordered echocardiogram to assess cardiac function. - Prescribed two-week heart monitor for arrhythmia evaluation. - Ordered TSH to rule out thyroid  dysfunction. - Performed EKG  before she left the clinic.  Asthma with allergic predisposition (elevated IgE) Asthma symptoms with elevated IgE indicating allergic predisposition. Symptoms partially relieved by inhaler. Possible allergen exposure suspected. Pulmonary evaluation ongoing. - Encouraged follow-up with pulmonology for further evaluation and management of asthma and potential allergens.              Follow-up: Return if symptoms worsen or fail to improve.  Signed, Darryle DASEN.  Barbaraann, MD, [facility]  [facility]  [address] [place], [place] [address] [phone]  10:51 AM

## 2024-03-07 NOTE — Progress Notes (Signed)
 Sent to pt as a clinical cytogeneticist message so she would have everything. NFN

## 2024-03-09 ENCOUNTER — Encounter: Payer: Self-pay | Admitting: Cardiovascular Disease

## 2024-03-09 ENCOUNTER — Ambulatory Visit

## 2024-03-09 ENCOUNTER — Ambulatory Visit: Attending: Cardiovascular Disease | Admitting: Cardiovascular Disease

## 2024-03-09 VITALS — BP 116/80 | HR 72 | Ht 63.0 in | Wt 140.0 lb

## 2024-03-09 DIAGNOSIS — R002 Palpitations: Secondary | ICD-10-CM | POA: Diagnosis not present

## 2024-03-09 DIAGNOSIS — R0602 Shortness of breath: Secondary | ICD-10-CM

## 2024-03-09 DIAGNOSIS — R42 Dizziness and giddiness: Secondary | ICD-10-CM | POA: Diagnosis not present

## 2024-03-09 NOTE — Patient Instructions (Signed)
 Medication Instructions:  Your physician recommends that you continue on your current medications as directed. Please refer to the Current Medication list given to you today.  Please note: We ask at that you not bring children with you during ultrasound (echo/ vascular) testing. Due to room size and safety concerns, children are not allowed in the ultrasound rooms during exams. Our front office staff cannot provide observation of children in our lobby area while testing is being conducted. An adult accompanying a patient to their appointment will only be allowed in the ultrasound room at the discretion of the ultrasound technician under special circumstances. We apologize for any inconvenience. u *If you need a refill on your cardiac medications before your next appointment, please call your pharmacy*  Lab Work: TSH today  Testing/Procedures: Your physician has requested that you have an echocardiogram. Echocardiography is a painless test that uses sound waves to create images of your heart. It provides your doctor with information about the size and shape of your heart and how well your heart's chambers and valves are working. This procedure takes approximately one hour. There are no restrictions for this procedure. Please do NOT wear cologne, perfume, aftershave, or lotions (deodorant is allowed). Please arrive 15 minutes prior to your appointment time.  Please note: We ask at that you not bring children with you during ultrasound (echo/ vascular) testing. Due to room size and safety concerns, children are not allowed in the ultrasound rooms during exams. Our front office staff cannot provide observation of children in our lobby area while testing is being conducted. An adult accompanying a patient to their appointment will only be allowed in the ultrasound room at the discretion of the ultrasound technician under special circumstances. We apologize for any inconvenience.  Zio 2 week  monitor   Follow-Up: At [facility], you and your health needs are our priority.  As part of our continuing mission to provide you with exceptional heart care, our providers are all part of one team.  This team includes your primary Cardiologist (physician) and Advanced Practice Providers or APPs (Physician Assistants and Nurse Practitioners) who all work together to provide you with the care you need, when you need it.  Your next appointment:    As needed  Provider:   Dr. Barbaraann  We recommend signing up for the patient portal called MyChart.  Sign up information is provided on this After Visit Summary.  MyChart is used to connect with patients for Virtual Visits (Telemedicine).  Patients are able to view lab/test results, encounter notes, upcoming appointments, etc.  Non-urgent messages can be sent to your provider as well.   To learn more about what you can do with MyChart, go to [email or web address].   Other Instructions ZIO XT- Long Term Monitor Instructions  Your physician has requested you wear a ZIO patch monitor for 14 days.  This is a single patch monitor. [employer] supplies one patch monitor per enrollment. Additional stickers are not available. Please do not apply patch if you will be having a Nuclear Stress Test,  Echocardiogram, Cardiac CT, MRI, or Chest Xray during the period you would be wearing the  monitor. The patch cannot be worn during these tests. You cannot remove and re-apply the  ZIO XT patch monitor.  Your ZIO patch monitor will be mailed 3 day USPS to your address on file. It may take 3-5 days  to receive your monitor after you have been enrolled.  Once you have received your monitor, please  review the enclosed instructions. Your monitor  has already been registered assigning a specific monitor serial # to you.  Billing and Patient Assistance Program Information  We have supplied [employer] with any of your insurance information on file for billing  purposes. [employer] offers a sliding scale Patient Assistance Program for patients that do not have  insurance, or whose insurance does not completely cover the cost of the ZIO monitor.  You must apply for the Patient Assistance Program to qualify for this discounted rate.  To apply, please call [employer] at [phone], select option 4, select option 2, ask to apply for  Patient Assistance Program. Meredeth will ask your household income, and how many people  are in your household. They will quote your out-of-pocket cost based on that information.  [employer] will also be able to set up a 59-month, interest-free payment plan if needed.  Applying the monitor   Shave hair from upper left chest.  Hold abrader disc by orange tab. Rub abrader in 40 strokes over the upper left chest as  indicated in your monitor instructions.  Clean area with 4 enclosed alcohol pads. Let dry.  Apply patch as indicated in monitor instructions. Patch will be placed under collarbone on left  side of chest with arrow pointing upward.  Rub patch adhesive wings for 2 minutes. Remove white label marked 1. Remove the white  label marked 2. Rub patch adhesive wings for 2 additional minutes.  While looking in a mirror, press and release button in center of patch. A small green light will  flash 3-4 times. This will be your only indicator that the monitor has been turned on.  Do not shower for the first 24 hours. You may shower after the first 24 hours.  Press the button if you feel a symptom. You will hear a small click. Record Date, Time and  Symptom in the Patient Logbook.  When you are ready to remove the patch, follow instructions on the last 2 pages of Patient  Logbook. Stick patch monitor onto the last page of Patient Logbook.  Place Patient Logbook in the blue and white box. Use locking tab on box and tape box closed  securely. The blue and white box has prepaid postage on it. Please place it in the mailbox as  soon  as possible. Your physician should have your test results approximately 7 days after the  monitor has been mailed back to [facility].  Call [facility] Customer Care at [phone] if you have questions regarding  your ZIO XT patch monitor. Call them immediately if you see an orange light blinking on your  monitor.  If your monitor falls off in less than 4 days, contact our Monitor department at [phone].  If your monitor becomes loose or falls off after 4 days call [employer] at [phone] for  suggestions on securing your monitor

## 2024-03-09 NOTE — Progress Notes (Unsigned)
 Enrolled for [employer] to mail a ZIO XT long term holter monitor to the patients address on file.

## 2024-03-10 ENCOUNTER — Ambulatory Visit: Payer: Self-pay | Admitting: Cardiovascular Disease

## 2024-03-10 LAB — TSH: TSH: 1.11 u[IU]/mL (ref 0.450–4.500)

## 2024-03-25 ENCOUNTER — Encounter (HOSPITAL_BASED_OUTPATIENT_CLINIC_OR_DEPARTMENT_OTHER)

## 2024-03-28 ENCOUNTER — Ambulatory Visit (INDEPENDENT_AMBULATORY_CARE_PROVIDER_SITE_OTHER)

## 2024-03-28 ENCOUNTER — Encounter (HOSPITAL_BASED_OUTPATIENT_CLINIC_OR_DEPARTMENT_OTHER): Payer: Self-pay

## 2024-03-28 DIAGNOSIS — R0602 Shortness of breath: Secondary | ICD-10-CM

## 2024-03-28 DIAGNOSIS — Z8709 Personal history of other diseases of the respiratory system: Secondary | ICD-10-CM

## 2024-03-28 DIAGNOSIS — R062 Wheezing: Secondary | ICD-10-CM

## 2024-03-28 DIAGNOSIS — R7689 Other specified abnormal immunological findings in serum: Secondary | ICD-10-CM

## 2024-03-28 LAB — PULMONARY FUNCTION TEST
DL/VA % pred: 132 %
DL/VA: 6.31 ml/min/mmHg/L
DLCO cor % pred: 93 %
DLCO cor: 19.96 ml/min/mmHg
DLCO unc % pred: 91 %
DLCO unc: 19.71 ml/min/mmHg
FEF 25-75 Post: 2.13 L/s
FEF 25-75 Pre: 1.35 L/s
FEF2575-%Change-Post: 57 %
FEF2575-%Pred-Post: 58 %
FEF2575-%Pred-Pre: 37 %
FEV1-%Change-Post: 19 %
FEV1-%Pred-Post: 55 %
FEV1-%Pred-Pre: 46 %
FEV1-Post: 1.79 L
FEV1-Pre: 1.5 L
FEV1FVC-%Change-Post: -5 %
FEV1FVC-%Pred-Pre: 91 %
FEV6-%Change-Post: 26 %
FEV6-%Pred-Post: 65 %
FEV6-%Pred-Pre: 52 %
FEV6-Post: 2.41 L
FEV6-Pre: 1.91 L
FEV6FVC-%Pred-Post: 99 %
FEV6FVC-%Pred-Pre: 99 %
FVC-%Change-Post: 26 %
FVC-%Pred-Post: 65 %
FVC-%Pred-Pre: 52 %
FVC-Post: 2.41 L
FVC-Pre: 1.91 L
Post FEV1/FVC ratio: 74 %
Post FEV6/FVC ratio: 100 %
Pre FEV1/FVC ratio: 78 %
Pre FEV6/FVC Ratio: 100 %
RV % pred: 342 %
RV: 4.02 L
TLC % pred: 135 %
TLC: 6.65 L

## 2024-03-28 NOTE — Progress Notes (Signed)
 Full PFT performed today.

## 2024-03-28 NOTE — Patient Instructions (Signed)
 Full PFT performed today.

## 2024-03-29 ENCOUNTER — Encounter: Payer: Self-pay | Admitting: Adult Health

## 2024-03-29 ENCOUNTER — Ambulatory Visit: Admitting: Adult Health

## 2024-03-29 VITALS — BP 110/68 | HR 78 | Temp 97.8°F | Ht 63.0 in | Wt 145.4 lb

## 2024-03-29 DIAGNOSIS — J4540 Moderate persistent asthma, uncomplicated: Secondary | ICD-10-CM

## 2024-03-29 DIAGNOSIS — J309 Allergic rhinitis, unspecified: Secondary | ICD-10-CM

## 2024-03-29 MED ORDER — TRELEGY ELLIPTA 100-62.5-25 MCG/ACT IN AEPB: 1.0000 | INHALATION_SPRAY | Freq: Every day | RESPIRATORY_TRACT | 5 refills | Status: DC

## 2024-03-29 MED ORDER — AZELASTINE-FLUTICASONE 137-50 MCG/ACT NA SUSP: 2.0000 | Freq: Two times a day (BID) | NASAL | 5 refills | Status: AC | PRN

## 2024-03-29 NOTE — Progress Notes (Signed)
 @Patient  ID: Amy Petersen, female    DOB: Sep 01, 2000, 23 y.o.   MRN: [id number]  Chief Complaint  Patient presents with   Medical Management of Chronic Issues    PFT f/u    Referring provider: Delores Suzann HERO, MD  HPI: 23 year old female followed for asthma Patient is from [place], previous history of malaria treated as a young child    TEST/EVENTS : Reviewed 03/29/2024  Chest x-ray February 26, 2024 clear Allergy panel February 26, 2024-strong positive to dust, positive to dog dander, chicken feathers, mold    Discussed the use of AI scribe software for clinical note transcription with the patient, who gave verbal consent to proceed.  History of Present Illness Amy Petersen is a 23 year old female with asthma who presents for a 1 month follow-up visit and review PFT results   She has been experiencing asthma symptoms since 2021, primarily characterized by shortness of breath and occasional wheezing. Cold air exacerbates her symptoms.  Recently started on Trelegy last visit  which she finds effective, and has an albuterol  inhaler for rescue use.  Feels that the Trelegy is helping with her breathing.  Has less shortness of breath.  Less wheezing.  In November, allergy testing revealed sensitivities to dust, dog dander, chickens feathers,  and mold. She lives in an apartment without carpet,  and she does not have any pets. She experiences postnasal drainage, particularly in the mornings, and has not been using any allergy medications like Claritin or Zyrtec. She tried Flonase  nasal spray but did not find it helpful. She has not used Singulair (montelukast) before and prefers not to take tablets.  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 She has no plans for pregnancy. Pulmonary function testing today in the office shows moderate to severe restriction with significant bronchodilator response, FEV1 55%, ratio 74, FVC 65%,  positive bronchodilator response, significant mid flow reversibility, DLCO 91%.    No Known Allergies  Immunization History  Administered Date(s) Administered   HPV 9-valent 01/27/2019, 02/02/2020, 05/23/2021   Influenza, Seasonal, Injecte, Preservative Fre 07/10/2023   Influenza,inj,Quad PF,6+ Mos 02/22/2019   Influenza-Unspecified 01/27/2019, 01/19/2021   Meningococcal B, Unspecified 01/27/2019, 02/02/2020   PFIZER(Purple Top)SARS-COV-2 Vaccination 08/26/2019, 09/14/2019   Tdap 01/27/2019    Past Medical History:  Diagnosis Date   Asthma    Malaria    Recurrent UTI    Typhoid     Tobacco History: Social History   Tobacco Use  Smoking Status Never  Smokeless Tobacco Never   Counseling given: Not Answered   Outpatient Medications Prior to Visit  Medication Sig Dispense Refill   albuterol  (VENTOLIN  HFA) 108 (90 Base) MCG/ACT inhaler Inhale 2 puffs into the lungs every 6 (six) hours as needed for wheezing or shortness of breath. 18 g 6   etonogestrel  (NEXPLANON ) 68 MG IMPL implant 1 each by Subdermal route once.     Fluticasone -Umeclidin-Vilant (TRELEGY ELLIPTA ) 100-62.5-25 MCG/ACT AEPB Inhale 1 puff into the lungs daily.     butalbital-acetaminophen -caffeine (FIORICET) 50-325-40 MG tablet Take 1 tablet by mouth every 6 (six) hours as needed. (Patient not taking: Reported on 03/29/2024)     fluticasone  (FLONASE ) 50 MCG/ACT nasal spray Place 2 sprays into both nostrils daily. (Patient not taking: Reported on 03/29/2024) 16 g 6   No facility-administered medications prior to visit.     Review of Systems:   Constitutional:   No  weight loss, night sweats,  Fevers,  chills, fatigue, or  lassitude.  HEENT:   No headaches,  Difficulty swallowing,  Tooth/dental problems, or  Sore throat,                No sneezing, itching, ear ache, +nasal congestion, post nasal drip,   CV:  No chest pain,  Orthopnea, PND, swelling in lower extremities, anasarca, dizziness, palpitations,  syncope.   GI  No heartburn, indigestion, abdominal pain, nausea, vomiting, diarrhea, change in bowel habits, loss of appetite, bloody stools.   Resp:  No chest wall deformity  Skin: no rash or lesions.  GU: no dysuria, change in color of urine, no urgency or frequency.  No flank pain, no hematuria   MS:  No joint pain or swelling.  No decreased range of motion.  No back pain.    Physical Exam  BP 110/68   Pulse 78   Temp 97.8 F (36.6 C)   Ht 5' 3 (1.6 m) Comment: Per pt  Wt 145 lb 6.4 oz (66 kg)   SpO2 100% Comment: RA  BMI 25.76 kg/m   GEN: A/Ox3; pleasant , NAD, well nourished    HEENT:  [place]/AT,  NOSE-clear, THROAT-clear, no lesions, no postnasal drip or exudate noted.   NECK:  Supple w/ fair ROM; no JVD; normal carotid impulses w/o bruits; no thyromegaly or nodules palpated; no lymphadenopathy.    RESP  Clear  P & A; w/o, wheezes/ rales/ or rhonchi. no accessory muscle use, no dullness to percussion  CARD:  RRR, no m/r/g, no peripheral edema, pulses intact, no cyanosis or clubbing.  GI:   Soft & nt; nml bowel sounds; no organomegaly or masses detected.   Musco: Warm bil, no deformities or joint swelling noted.   Neuro: alert, no focal deficits noted.    Skin: Warm, no lesions or rashes    Lab Results:Reviewed 03/29/2024   CBC    Component Value Date/Time   WBC 5.3 02/26/2024 1134   RBC 4.66 02/26/2024 1134   HGB 13.0 02/26/2024 1134   HGB 13.3 09/18/2023 0929   HCT 38.1 02/26/2024 1134   HCT 41.0 09/18/2023 0929   PLT 336.0 02/26/2024 1134   PLT 343 09/18/2023 0929   MCV 81.7 02/26/2024 1134   MCV 85 09/18/2023 0929   MCH 27.7 09/18/2023 0929   MCH 27.9 01/11/2022 0411   MCHC 34.0 02/26/2024 1134   RDW 13.2 02/26/2024 1134   RDW 13.3 09/18/2023 0929   LYMPHSABS 2.4 02/26/2024 1134   LYMPHSABS 2.7 02/22/2019 1641   MONOABS 0.4 02/26/2024 1134   EOSABS 0.2 02/26/2024 1134   EOSABS 0.2 02/22/2019 1641   BASOSABS 0.0 02/26/2024 1134   BASOSABS  0.0 02/22/2019 1641    BMET    Component Value Date/Time   NA 140 01/11/2022 0411   NA 134 02/22/2019 1641   K 3.6 01/11/2022 0411   CL 106 01/11/2022 0411   CO2 23 01/11/2022 0411   GLUCOSE 121 (H) 01/11/2022 0411   BUN 13 01/11/2022 0411   BUN 15 02/22/2019 1641   CREATININE 0.89 01/11/2022 0411   CALCIUM 10.0 01/11/2022 0411   GFRNONAA >60 01/11/2022 0411   GFRAA CANCELED 02/22/2019 1641    BNP No results found for: BNP  ProBNP No results found for: PROBNP  Imaging: No results found.  Administration History     None          Latest Ref Rng & Units 03/28/2024    8:29 AM  PFT Results  FVC-Pre L  1.91  P  FVC-Predicted Pre % 52  P  FVC-Post L 2.41  P  FVC-Predicted Post % 65  P  Pre FEV1/FVC % % 78  P  Post FEV1/FCV % % 74  P  FEV1-Pre L 1.50  P  FEV1-Predicted Pre % 46  P  FEV1-Post L 1.79  P  DLCO uncorrected ml/min/mmHg 19.71  P  DLCO UNC% % 91  P  DLCO corrected ml/min/mmHg 19.96  P  DLCO COR %Predicted % 93  P  DLVA Predicted % 132  P  TLC L 6.65  P  TLC % Predicted % 135  P  RV % Predicted % 342  P    P Preliminary result    No results found for: NITRICOXIDE      No data to display              Assessment & Plan:   Assessment and Plan Assessment & Plan Asthma-Moderate persistent with allergic phenotype-improved control  Symptoms include dyspnea, occasional wheezing, and postnasal drainage.  Pulmonary function testing consistent with asthma with significant bronchodilator response, moderate restriction noted.  Patient has had significant benefit on Trelegy.   Continue Trelegy inhaler once daily. Ensure proper inhaler technique: brush, rinse, and gargle after use. Use albuterol  inhaler as needed for rescue.  Monitor insurance coverage for Trelegy and contact the provider if issues arise. Asthma action plan discussed.  Use albuterol  as needed.  Trigger prevention.  Discussed adding in Singulair however patient declines at this  time.  Will add in Dymista  nasal spray.  Allergic rhinitis  -ongoing symptoms. Allergy panel  positive for allergies to dust, dog dander, mold, and other allergens. Symptoms include postnasal drainage and mucous production, especially upon waking. Flonase  nasal spray was previously ineffective. Discussed potential addition of Singulair, but she prefers to avoid tablets. Zyrtec and Dymista  nasal spray a will be added  Prescribed Dymista  nasal spray, two puffs twice daily. Recommended Zyrtec over-the-counter as an alternative allergy medication. Advised use of allergy filters and pillowcases at home. Encouraged frequent dusting and washing of bedding.  Plan  Patient Instructions  Continue on Trelegy 1 puff daily, rinse after use.  Albuterol  inhaler As needed   May try Zyrtec 10mg  daily As needed  drainage  Begin Dymista  Nasal 2 puffs Twice daily  Allergy filters and Pillow cases may help some at home.  Follow up in 3-4 months with Dr. Geronimo and As needed   THEORA Madelin Stank, NP 03/29/2024

## 2024-03-29 NOTE — Patient Instructions (Addendum)
 Continue on Trelegy 1 puff daily, rinse after use.  Albuterol  inhaler As needed   May try Zyrtec 10mg  daily As needed  drainage  Begin Dymista  Nasal 2 puffs Twice daily  Allergy filters and Pillow cases may help some at home.  Follow up in 3-4 months with Dr. Geronimo and As needed

## 2024-04-03 DIAGNOSIS — R002 Palpitations: Secondary | ICD-10-CM

## 2024-04-03 DIAGNOSIS — R0602 Shortness of breath: Secondary | ICD-10-CM | POA: Diagnosis not present

## 2024-04-11 ENCOUNTER — Telehealth: Payer: Self-pay

## 2024-04-11 NOTE — Telephone Encounter (Signed)
 Copied from CRM [id number]. Topic: Clinical - Prescription Issue >> Apr 11, 2024 10:07 AM [name] wrote: Reason for CRM: pt stated [facility] advised her that she needs to get approval for her Fluticasone -Umeclidin-Vilant (TRELEGY ELLIPTA ) 100-62.5-25 MCG/ACT AEPB. Pt stated she is completely out of the medicine.   ATC x1. LMTCB.

## 2024-04-12 NOTE — Telephone Encounter (Signed)
 Copied from CRM [id number]. Topic: Clinical - Prescription Issue >> Apr 11, 2024 10:07 AM Amy Petersen wrote: Reason for CRM: pt stated [facility] advised her that she needs to get approval for her Fluticasone -Umeclidin-Vilant (TRELEGY ELLIPTA ) 100-62.5-25 MCG/ACT AEPB. Pt stated she is completely out of the medicine. >> Apr 11, 2024  5:05 PM Amy Petersen wrote: Pt returning calling stated send message via my chart and she will return call if needed.    Sending Mychart message.

## 2024-04-12 NOTE — Telephone Encounter (Signed)
 Copied from CRM [id number]. Topic: Clinical - Prescription Issue >> Apr 12, 2024 10:08 AM Amy Petersen wrote: Reason for CRM: Pt returning Remlap phone  call regarding medication, contacted [facility] no answer. Please f/u with pt. Pt would like a callback at 12:30.   ATC x3. No answer- lmtcb.

## 2024-04-12 NOTE — Telephone Encounter (Signed)
 ATC x2 LMTCB

## 2024-04-13 ENCOUNTER — Ambulatory Visit (HOSPITAL_COMMUNITY)
Admission: RE | Admit: 2024-04-13 | Discharge: 2024-04-13 | Disposition: A | Discharge status: Home / Self Care | Source: Ambulatory Visit | Attending: Cardiovascular Disease | Admitting: Cardiovascular Disease

## 2024-04-13 DIAGNOSIS — R0602 Shortness of breath: Secondary | ICD-10-CM | POA: Insufficient documentation

## 2024-04-16 LAB — ECHOCARDIOGRAM COMPLETE
Area-P 1/2: 6.07 cm2
S' Lateral: 2.55 cm

## 2024-04-17 ENCOUNTER — Other Ambulatory Visit: Payer: Self-pay

## 2024-04-17 ENCOUNTER — Emergency Department (HOSPITAL_BASED_OUTPATIENT_CLINIC_OR_DEPARTMENT_OTHER)
Admission: EM | Admit: 2024-04-17 | Discharge: 2024-04-17 | Disposition: A | Discharge status: Home / Self Care | Source: Ambulatory Visit | Attending: Emergency Medicine | Admitting: Emergency Medicine

## 2024-04-17 DIAGNOSIS — Z7951 Long term (current) use of inhaled steroids: Secondary | ICD-10-CM | POA: Diagnosis not present

## 2024-04-17 DIAGNOSIS — J4541 Moderate persistent asthma with (acute) exacerbation: Secondary | ICD-10-CM | POA: Insufficient documentation

## 2024-04-17 DIAGNOSIS — J45901 Unspecified asthma with (acute) exacerbation: Secondary | ICD-10-CM

## 2024-04-17 DIAGNOSIS — R0602 Shortness of breath: Secondary | ICD-10-CM | POA: Diagnosis present

## 2024-04-17 LAB — CBG MONITORING, ED: Glucose-Capillary: 84 mg/dL (ref 70–99)

## 2024-04-17 MED ORDER — IPRATROPIUM-ALBUTEROL 0.5-2.5 (3) MG/3ML IN SOLN: 3.0000 mL | Freq: Once | RESPIRATORY_TRACT | Status: AC

## 2024-04-17 MED ORDER — PREDNISONE 50 MG PO TABS: ORAL_TABLET | ORAL | 0 refills | Status: AC

## 2024-04-17 MED ORDER — IPRATROPIUM-ALBUTEROL 0.5-2.5 (3) MG/3ML IN SOLN
RESPIRATORY_TRACT | Status: AC
  Filled 2024-04-17: qty 3

## 2024-04-17 MED ORDER — ALBUTEROL SULFATE HFA 108 (90 BASE) MCG/ACT IN AERS
2.0000 | INHALATION_SPRAY | RESPIRATORY_TRACT | Status: DC
  Filled 2024-04-17: qty 6.7

## 2024-04-17 MED ORDER — PREDNISONE 50 MG PO TABS: 60.0000 mg | ORAL_TABLET | Freq: Every day | ORAL | Status: DC

## 2024-04-17 MED ORDER — PREDNISONE 50 MG PO TABS
60.0000 mg | ORAL_TABLET | Freq: Once | ORAL | Status: AC
  Administered 2024-04-17: 60 mg via ORAL
  Filled 2024-04-17: qty 1

## 2024-04-17 NOTE — ED Triage Notes (Signed)
 She c/o Asthma past few days. She took Albuterol  inhaler without sufficient relief. Our R.T. meets her and assesses her upon arrival. B.B.S. clear.

## 2024-04-17 NOTE — ED Notes (Signed)
 Pt discharged home after verbalizing understanding of discharge instructions; nad noted.

## 2024-04-17 NOTE — Discharge Instructions (Addendum)
 Return if any problems.

## 2024-04-18 ENCOUNTER — Telehealth: Payer: Self-pay

## 2024-04-18 DIAGNOSIS — J4550 Severe persistent asthma, uncomplicated: Secondary | ICD-10-CM

## 2024-04-18 MED ORDER — SPIRIVA RESPIMAT 1.25 MCG/ACT IN AERS: 2.0000 | INHALATION_SPRAY | Freq: Every day | RESPIRATORY_TRACT | 6 refills | Status: DC

## 2024-04-18 MED ORDER — FLUTICASONE-SALMETEROL 250-50 MCG/ACT IN AEPB: 1.0000 | INHALATION_SPRAY | Freq: Two times a day (BID) | RESPIRATORY_TRACT | 11 refills | Status: DC

## 2024-04-18 NOTE — ED Provider Notes (Signed)
 " [place] EMERGENCY DEPARTMENT AT [facility] Provider Note   CSN: [id number] Arrival date & time: 04/17/24  [id number]     Patient presents with: asthma   Amy Petersen is a 23 y.o. female.   Patient complains of shortness of breath.  Patient states that she went to the pharmacy to pick up her albuterol  inhaler and her trilogy inhaler and was told by the pharmacy that she had to have preapproval.  Patient reports that she became short of breath and did not have an inhaler to use.  Patient denies any fever she denies any chills.  Patient states she tried to contact her primary care physician.  Patient has a past medical history of asthma.        Prior to Admission medications  Medication Sig Start Date End Date Taking? Authorizing Provider  predniSONE  (DELTASONE ) 50 MG tablet One tablet a day for 4 days.  Begin on 12/29. 04/17/24  Yes Flint Sonny POUR, PA-C  albuterol  (VENTOLIN  HFA) 108 (90 Base) MCG/ACT inhaler Inhale 2 puffs into the lungs every 6 (six) hours as needed for wheezing or shortness of breath. 07/10/23   Delores Suzann HERO, MD  Azelastine -Fluticasone  (DYMISTA ) 137-50 MCG/ACT SUSP Place 2 sprays into the nose 2 (two) times daily as needed. 03/29/24   Parrett, Madelin RAMAN, NP  butalbital-acetaminophen -caffeine (FIORICET) 50-325-40 MG tablet Take 1 tablet by mouth every 6 (six) hours as needed. Patient not taking: Reported on 03/29/2024 05/10/23 05/09/24  [provider]  etonogestrel  (NEXPLANON ) 68 MG IMPL implant 1 each by Subdermal route once.    [provider]  Fluticasone -Umeclidin-Vilant (TRELEGY ELLIPTA ) 100-62.5-25 MCG/ACT AEPB Inhale 1 puff into the lungs daily. 03/29/24   Parrett, Madelin RAMAN, NP    Allergies: Patient has no known allergies.    Review of Systems  Respiratory:  Positive for shortness of breath.   All other systems reviewed and are negative.   Updated Vital Signs BP 109/76   Pulse 70   Temp 98.2 F (36.8 C) (Oral)   Resp 18   LMP  03/27/2024 (Approximate)   SpO2 98%   Physical Exam Vitals and nursing note reviewed.  Constitutional:      Appearance: She is well-developed.  HENT:     Head: Normocephalic.  Cardiovascular:     Rate and Rhythm: Normal rate.  Pulmonary:     Effort: Pulmonary effort is normal.  Abdominal:     General: There is no distension.  Musculoskeletal:        General: Normal range of motion.     Cervical back: Normal range of motion.  Skin:    General: Skin is warm.  Neurological:     General: No focal deficit present.     Mental Status: She is alert and oriented to person, place, and time.     (all labs ordered are listed, but only abnormal results are displayed) Labs Reviewed  CBG MONITORING, ED    EKG: None  Radiology: No results found.   Procedures   Medications Ordered in the ED  ipratropium-albuterol  (DUONEB) 0.5-2.5 (3) MG/3ML nebulizer solution 3 mL ( Nebulization Given 04/17/24 1733)  predniSONE  (DELTASONE ) tablet 60 mg (60 mg Oral Given 04/17/24 1852)                                    Medical Decision Making Patient complains of feeling short of breath she has asthma.  She  was unable to get her inhalers filled because they required preapproval.  Risk Prescription drug management. Risk Details: Patient's lungs are clear.  She feels short of breath respiratory therapy gave patient a DuoNeb.  Patient feels much improved.  Patient is given a dose of prednisone  and a prescription for prednisone  she is advised to contact her primary care physician to discuss the need for prior authorization.  Patient is given an albuterol  inhaler and an AeroChamber while in the emergency department.  She is given a prescription for prednisone  and discharged in stable condition        Final diagnoses:  Moderate asthma with exacerbation, unspecified whether persistent    ED Discharge Orders          Ordered    predniSONE  (DELTASONE ) 50 MG tablet        04/17/24 1846             An After Visit Summary was printed and given to the patient.    Flint Sonny POUR, [place] 04/18/24 9060  "

## 2024-04-18 NOTE — Telephone Encounter (Signed)
*  Pulm  Pharmacy Patient Advocate Encounter   Received notification from Patient Advice Request messages that prior authorization for Trelegy is required/requested.   Insurance verification completed.   The patient is insured through [employer].   Per test claim:  *See Below is preferred by the insurance.  If suggested medication is appropriate, Please send in a new RX and discontinue this one. If not, please advise as to why it's not appropriate so that we may request a Prior Authorization. Please note, some preferred medications may still require a PA.  If the suggested medications have not been trialed and there are no contraindications to their use, the PA will not be submitted, as it will not be approved.   Medicaid covers individual LAMA inhalers, such as: Spiriva and Incruse, that can be trialed in addition to the preferred ICS/LABA agents (Advair, Symbicort , and Dulera).   After 1-3 month trial of 2 preferred ICS/LABA agents, plan will likely cover combination triple therapy inhaler.

## 2024-04-18 NOTE — Telephone Encounter (Signed)
 She was already in ER yeser day for asthma  Ok to look at split MDI instead of trelegy Sent in advair disc and spiriva

## 2024-04-19 NOTE — Telephone Encounter (Signed)
 Pt notified through mychart in 12/22 encounter.

## 2024-04-25 ENCOUNTER — Other Ambulatory Visit: Payer: Self-pay

## 2024-04-25 ENCOUNTER — Encounter (HOSPITAL_BASED_OUTPATIENT_CLINIC_OR_DEPARTMENT_OTHER): Payer: Self-pay | Admitting: *Deleted

## 2024-04-25 DIAGNOSIS — J45909 Unspecified asthma, uncomplicated: Secondary | ICD-10-CM | POA: Insufficient documentation

## 2024-04-25 DIAGNOSIS — Z5321 Procedure and treatment not carried out due to patient leaving prior to being seen by health care provider: Secondary | ICD-10-CM | POA: Diagnosis not present

## 2024-04-25 DIAGNOSIS — R0602 Shortness of breath: Secondary | ICD-10-CM | POA: Diagnosis present

## 2024-04-25 NOTE — ED Triage Notes (Signed)
 Pt with hx of asthma is here due to feeling sob which was not relieved despite using 3 different inhalers (used larger dose than prescribed) after which she felt like her heart was beating fast and she felt shaky.  Speaking in full sentences in triage, no obvious distress.

## 2024-04-26 NOTE — ED Notes (Signed)
 Per registration, pt left department.

## 2024-04-28 ENCOUNTER — Ambulatory Visit: Admitting: Primary Care

## 2024-04-28 ENCOUNTER — Encounter: Payer: Self-pay | Admitting: Primary Care

## 2024-04-28 VITALS — BP 109/73 | HR 85 | Temp 98.6°F | Ht 63.0 in | Wt 143.4 lb

## 2024-04-28 DIAGNOSIS — Z9109 Other allergy status, other than to drugs and biological substances: Secondary | ICD-10-CM

## 2024-04-28 DIAGNOSIS — J3089 Other allergic rhinitis: Secondary | ICD-10-CM | POA: Diagnosis not present

## 2024-04-28 DIAGNOSIS — J4550 Severe persistent asthma, uncomplicated: Secondary | ICD-10-CM

## 2024-04-28 DIAGNOSIS — R0602 Shortness of breath: Secondary | ICD-10-CM

## 2024-04-28 MED ORDER — MONTELUKAST SODIUM 10 MG PO TABS: 10.0000 mg | ORAL_TABLET | Freq: Every day | ORAL | 5 refills | Status: DC

## 2024-04-28 MED ORDER — ALBUTEROL SULFATE (2.5 MG/3ML) 0.083% IN NEBU: 2.5000 mg | INHALATION_SOLUTION | Freq: Four times a day (QID) | RESPIRATORY_TRACT | 12 refills | Status: AC | PRN

## 2024-04-28 MED ORDER — ALBUTEROL SULFATE HFA 108 (90 BASE) MCG/ACT IN AERS: 2.0000 | INHALATION_SPRAY | Freq: Four times a day (QID) | RESPIRATORY_TRACT | 6 refills | Status: AC | PRN

## 2024-04-28 MED ORDER — ADVAIR DISKUS 500-50 MCG/ACT IN AEPB: 1.0000 | INHALATION_SPRAY | Freq: Two times a day (BID) | RESPIRATORY_TRACT | 5 refills | Status: DC

## 2024-04-28 NOTE — Progress Notes (Signed)
 "  @Patient  ID: Amy Petersen, female    DOB: [birth year], 24 y.o.   MRN: [id number]  Chief Complaint  Patient presents with   Acute Visit    Hospital f/u - hospital for asthma  Pt states she is no better since ED visits in December and January.    Referring provider: Delores Suzann HERO, MD  HPI: 24 year old female, never smoked. PMH significant for asthma.   04/28/2024 Discussed the use of AI scribe software for clinical note transcription with the patient, who gave verbal consent to proceed.  History of Present Illness Amy Petersen is a 24 year old female with asthma who presents for a follow-up after an emergency room visit for shortness of breath.  She has a history of asthma and has visited the emergency room twice for shortness of breath. Despite using her prescribed medications, she continues to experience episodes of shortness of breath and wheezing. She was previously on Trelegy but was switched to Advair   250mcg one puff twice daily and Spiriva  Respimat 2.5mcg two puffs daily due to insurance coverage issues.   She has been unable to obtain her albuterol  rescue inhaler due to authorization issues, which she needs during episodes. She describes her symptoms as 'episodes after episodes' and notes that she overtook her medication recently, leading to symptoms of a racing heart and shaking. Her Medicaid is active, but there have been delays in medication authorization.  No chest tightness but experiences some wheezing and no cough. Her symptoms include shortness of breath and wheezing, particularly when exerting herself. She has a history of elevated eosinophils and positive allergy tests for dust mites, dog dander, chicken feathers, cow dander, and mold spores.  She is not currently on prednisone  and has not received any medications since her emergency room visits. She mentions having mucus in the mornings, which she feels contributes to a blockage  sensation.   Allergies[1]  Immunization History  Administered Date(s) Administered   HPV 9-valent 01/27/2019, 02/02/2020, 05/23/2021   Influenza, Seasonal, Injecte, Preservative Fre 07/10/2023   Influenza,inj,Quad PF,6+ Mos 02/22/2019   Influenza-Unspecified 01/27/2019, 01/19/2021   Meningococcal B, Unspecified 01/27/2019, 02/02/2020   PFIZER(Purple Top)SARS-COV-2 Vaccination 08/26/2019, 09/14/2019   Tdap 01/27/2019    Past Medical History:  Diagnosis Date   Asthma    Malaria    Recurrent UTI    Typhoid     Tobacco History: Tobacco Use History[2] Counseling given: Not Answered   Outpatient Medications Prior to Visit  Medication Sig Dispense Refill   Azelastine -Fluticasone  (DYMISTA ) 137-50 MCG/ACT SUSP Place 2 sprays into the nose 2 (two) times daily as needed. 1 g 5   butalbital-acetaminophen -caffeine (FIORICET) 50-325-40 MG tablet Take 1 tablet by mouth every 6 (six) hours as needed.     etonogestrel  (NEXPLANON ) 68 MG IMPL implant 1 each by Subdermal route once.     fluticasone -salmeterol (ADVAIR ) 250-50 MCG/ACT AEPB Inhale 1 puff into the lungs every 12 (twelve) hours. 60 each 11   albuterol  (VENTOLIN  HFA) 108 (90 Base) MCG/ACT inhaler Inhale 2 puffs into the lungs every 6 (six) hours as needed for wheezing or shortness of breath. (Patient not taking: Reported on 04/28/2024) 18 g 6   predniSONE  (DELTASONE ) 50 MG tablet One tablet a day for 4 days.  Begin on 12/29. (Patient not taking: Reported on 04/28/2024) 4 tablet 0   Tiotropium Bromide (SPIRIVA  RESPIMAT) 1.25 MCG/ACT AERS Inhale 2 puffs into the lungs daily. (Patient not taking: Reported on 04/28/2024) 1 g 6   No facility-administered  medications prior to visit.   Review of Systems  Review of Systems  Constitutional: Negative.   Respiratory:  Positive for shortness of breath and wheezing.    Physical Exam  BP 109/73   Pulse 85   Temp 98.6 F (37 C)   Ht 5' 3 (1.6 m) Comment: PER PT  Wt 143 lb 6.4 oz (65 kg)    LMP 03/27/2024 (Approximate)   SpO2 99% Comment: RA  BMI 25.40 kg/m  Physical Exam Constitutional:      General: She is not in acute distress.    Appearance: Normal appearance. She is not ill-appearing.  HENT:     Head: Normocephalic and atraumatic.  Cardiovascular:     Rate and Rhythm: Normal rate and regular rhythm.  Pulmonary:     Effort: Pulmonary effort is normal.     Breath sounds: Normal breath sounds. No wheezing, rhonchi or rales.  Skin:    General: Skin is warm and dry.  Neurological:     General: No focal deficit present.     Mental Status: She is alert and oriented to person, place, and time. Mental status is at baseline.  Psychiatric:        Mood and Affect: Mood normal.        Behavior: Behavior normal.        Thought Content: Thought content normal.        Judgment: Judgment normal.      Lab Results:  CBC    Component Value Date/Time   WBC 5.3 02/26/2024 1134   RBC 4.66 02/26/2024 1134   HGB 13.0 02/26/2024 1134   HGB 13.3 09/18/2023 0929   HCT 38.1 02/26/2024 1134   HCT 41.0 09/18/2023 0929   PLT 336.0 02/26/2024 1134   PLT 343 09/18/2023 0929   MCV 81.7 02/26/2024 1134   MCV 85 09/18/2023 0929   MCH 27.7 09/18/2023 0929   MCH 27.9 01/11/2022 0411   MCHC 34.0 02/26/2024 1134   RDW 13.2 02/26/2024 1134   RDW 13.3 09/18/2023 0929   LYMPHSABS 2.4 02/26/2024 1134   LYMPHSABS 2.7 02/22/2019 1641   MONOABS 0.4 02/26/2024 1134   EOSABS 0.2 02/26/2024 1134   EOSABS 0.2 02/22/2019 1641   BASOSABS 0.0 02/26/2024 1134   BASOSABS 0.0 02/22/2019 1641    BMET    Component Value Date/Time   NA 140 01/11/2022 0411   NA 134 02/22/2019 1641   K 3.6 01/11/2022 0411   CL 106 01/11/2022 0411   CO2 23 01/11/2022 0411   GLUCOSE 121 (H) 01/11/2022 0411   BUN 13 01/11/2022 0411   BUN 15 02/22/2019 1641   CREATININE 0.89 01/11/2022 0411   CALCIUM 10.0 01/11/2022 0411   GFRNONAA >60 01/11/2022 0411   GFRAA CANCELED 02/22/2019 1641    BNP No results  found for: BNP  ProBNP No results found for: PROBNP  Imaging: ECHOCARDIOGRAM COMPLETE Result Date: 04/16/2024    ECHOCARDIOGRAM REPORT   Patient Name:   Amy Petersen Date of Exam: 04/13/2024 Medical Rec #:  [id number]      Height:       63.0 in Accession #:    [id number]     Weight:       145.4 lb Date of Birth:  [birth year]     BSA:          1.689 m Patient Age:    22 years       BP:           116/79 mmHg  Patient Gender: F              HR:           63 bpm. Exam Location:  [address] Procedure: 2D Echo, 3D Echo and Strain Analysis (Both Spectral and Color Flow            Doppler were utilized during procedure). Indications:    R06.02 SOB  History:        Patient has no prior history of Echocardiogram examinations.                 Asthma. Palpitations. Dizziness.  Sonographer:    Jon Hacker RCS Referring Phys: [id number] DARRYLE NED O'NEAL IMPRESSIONS  1. Left ventricular ejection fraction, by estimation, is 55 to 60%. Left ventricular ejection fraction by 3D volume is 56 %. The left ventricle has normal function. The left ventricle has no regional wall motion abnormalities. Left ventricular diastolic  parameters were normal. The average left ventricular global longitudinal strain is -22.1 %. The global longitudinal strain is normal.  2. Right ventricular systolic function is normal. The right ventricular size is normal.  3. The mitral valve is normal in structure. Trivial mitral valve regurgitation. No evidence of mitral stenosis.  4. The aortic valve is normal in structure. Aortic valve regurgitation is not visualized. No aortic stenosis is present.  5. The inferior vena cava is normal in size with greater than 50% respiratory variability, suggesting right atrial pressure of 3 mmHg. FINDINGS  Left Ventricle: Left ventricular ejection fraction, by estimation, is 55 to 60%. Left ventricular ejection fraction by 3D volume is 56 %. The left ventricle has normal function. The left ventricle has no  regional wall motion abnormalities. The average left ventricular global longitudinal strain is -22.1 %. Strain was performed and the global longitudinal strain is normal. The left ventricular internal cavity size was normal in size. There is no left ventricular hypertrophy. Left ventricular diastolic parameters were normal. Right Ventricle: The right ventricular size is normal. No increase in right ventricular wall thickness. Right ventricular systolic function is normal. Left Atrium: Left atrial size was normal in size. Right Atrium: Right atrial size was normal in size. Pericardium: There is no evidence of pericardial effusion. Mitral Valve: The mitral valve is normal in structure. Trivial mitral valve regurgitation. No evidence of mitral valve stenosis. Tricuspid Valve: The tricuspid valve is normal in structure. Tricuspid valve regurgitation is trivial. No evidence of tricuspid stenosis. Aortic Valve: The aortic valve is normal in structure. Aortic valve regurgitation is not visualized. No aortic stenosis is present. Pulmonic Valve: The pulmonic valve was normal in structure. Pulmonic valve regurgitation is mild. No evidence of pulmonic stenosis. Aorta: The aortic root is normal in size and structure. Venous: The inferior vena cava is normal in size with greater than 50% respiratory variability, suggesting right atrial pressure of 3 mmHg. IAS/Shunts: No atrial level shunt detected by color flow Doppler. Additional Comments: 3D was performed not requiring image post processing on an independent workstation and was normal.  LEFT VENTRICLE PLAX 2D LVIDd:         3.51 cm         Diastology LVIDs:         2.55 cm         LV e' medial:    12.90 cm/s LV PW:         0.81 cm         LV E/e' medial:  7.4 LV IVS:  0.92 cm         LV e' lateral:   17.20 cm/s LVOT diam:     1.90 cm         LV E/e' lateral: 5.6 LV SV:         56 LV SV Index:   33              2D Longitudinal LVOT Area:     2.84 cm        Strain                                 2D Strain GLS   -23.1 %                                (A4C):                                2D Strain GLS   -20.6 %                                (A3C):                                2D Strain GLS   -22.7 %                                (A2C):                                2D Strain GLS   -22.1 %                                Avg:                                 3D Volume EF                                LV 3D EF:    Left                                             ventricul                                             ar                                             ejection  fraction                                             by 3D                                             volume is                                             56 %.                                 3D Volume EF:                                3D EF:        56 %                                LV EDV:       110 ml                                LV ESV:       49 ml                                LV SV:        61 ml RIGHT VENTRICLE RV Basal diam:  2.62 cm RV S prime:     10.50 cm/s TAPSE (M-mode): 1.9 cm LEFT ATRIUM             Index        RIGHT ATRIUM          Index LA diam:        2.80 cm 1.66 cm/m   RA Area:     8.16 cm LA Vol (A2C):   24.5 ml 14.51 ml/m  RA Volume:   15.60 ml 9.24 ml/m LA Vol (A4C):   20.3 ml 12.02 ml/m LA Biplane Vol: 23.7 ml 14.04 ml/m  AORTIC VALVE LVOT Vmax:   98.00 cm/s LVOT Vmean:  62.400 cm/s LVOT VTI:    0.197 m  AORTA Ao Root diam: 2.80 cm Ao Asc diam:  2.40 cm MITRAL VALVE MV Area (PHT): 6.07 cm    SHUNTS MV Decel Time: 125 msec    Systemic VTI:  0.20 m MV E velocity: 95.90 cm/s  Systemic Diam: 1.90 cm MV A velocity: 63.90 cm/s MV E/A ratio:  1.50 Toribio Fuel MD Electronically signed by Toribio Fuel MD Signature Date/Time: 04/16/2024/8:04:14 PM    Final    LONG TERM MONITOR (3-14 DAYS) Result Date: 04/03/2024 Patch Wear Time:  13  days and 10 hours (2025-11-22T22:00:21-0500 to 2025-12-06T08:05:24-0500) Patient had a min HR of 50 bpm (sinus bradycardia), max HR of 190 bpm (sinus tachycardia), and avg HR of 81 bpm (normal sinus rhythm). Predominant underlying rhythm  was Sinus Rhythm. Isolated SVEs were rare (<1.0%), and no SVE Couplets or SVE Triplets were present. Isolated VEs were rare (<1.0%), and no VE Couplets  or VE Triplets were present. Impression: No arrhythmias detected. Rare ectopy. Signed, Darryle DASEN. Barbaraann, MD, [facility]  [facility] [address] [place], [place] [address] [phone] 9:56 PM    Assessment & Plan:  1. Shortness of breath - albuterol  (VENTOLIN  HFA) 108 (90 Base) MCG/ACT inhaler; Inhale 2 puffs into the lungs every 6 (six) hours as needed for wheezing or shortness of breath.  Dispense: 18 g; Refill: 6  2. Severe persistent asthma, unspecified whether complicated (HCC) (Primary) - Ambulatory referral to Allergy - AMB REFERRAL FOR DME  3. House dust mite allergy - Ambulatory referral to Allergy   Assessment and Plan Assessment & Plan Severe persistent asthma with recurrent exacerbations Asthma exacerbation with shortness of breath and occasional wheezing. Current regimen of Advair  250-50mcg and Spiriva  is insufficient. Insurance issues led to a switch from Trelegy to Advair  and Spiriva . No current use of prednisone . Elevated eosinophils 200 and significant allergy testing results. Discussed increasing Advair  to a higher dose to better control inflammation and symptoms. Discussed potential side effects of increased steroid dose, including thrush, and the importance of rinsing mouth after use. Albuterol  nebulizer prescribed for emergency use if rescue inhaler is ineffective. Discussed avoiding asthma triggers such as strong perfumes and chemicals. - Increased Advair  to 500-29mcg, one puff twice daily. - Continue Spiriva  Respimat 2.5mcg two puffs once daily  - Refilled albuterol  inhaler   - Prescribed Albuterol  nebulizer for home use every six hours during exacerbations. - Unable to perform FENO test  - Advised on avoiding asthma triggers such as strong perfumes and chemicals.  Dust mite allergy Significant allergic rhinitis due to dust mite allergy, with positive allergy testing for dust mites (class 5), dog (class 1), chicken feathers, cow dander, and mold spores. Symptoms include sneezing, itching, and runny nose. Discussed potential need for allergy shots and referral to allergist. Singulair  prescribed to help with mucus production and airway swelling. - Prescribed Singulair  (montelukast ) 10mg  to be taken at bedtime. - Referred to allergist for further evaluation and potential allergy shots. - Provided information on dust mite avoidance strategies.   Almarie LELON Ferrari, NP 04/28/2024     [1] No Known Allergies [2]  Social History Tobacco Use  Smoking Status Never  Smokeless Tobacco Never   "

## 2024-04-28 NOTE — Patient Instructions (Addendum)
 "   VISIT SUMMARY: Amy Petersen, you visited us  today for a follow-up after experiencing shortness of breath and wheezing despite your current asthma medications. We discussed your recent emergency room visits and the challenges you've faced with medication authorization. We also reviewed your allergy test results and symptoms.  YOUR PLAN: -ASTHMA WITH ACUTE EXACERBATION: Asthma is a condition where your airways become inflamed and narrow, making it hard to breathe. Your current medications, Advair  and Spiriva , are not fully controlling your symptoms. We have increased your Advair  dose to 500 mcg, one puff twice daily, and refilled your albuterol  inhaler. A nebulizer has been prescribed for use every six hours during exacerbations. Advised you to avoid asthma triggers like strong perfumes and chemicals.  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 You have significant allergies to dust mites, dog dander, chicken feathers, cow dander, and mold spores. We prescribed Singulair  (montelukast ) to be taken at bedtime to help with mucus production and airway swelling. You have been referred to an allergist for further evaluation and potential allergy shots. Additionally, we provided information on how to avoid dust mites.  INSTRUCTIONS: Please follow up with the allergist as referred for further evaluation and potential allergy shots. Use the nebulizer every six hours during asthma exacerbations and take Singulair  at bedtime. Avoid asthma triggers such as strong perfumes and chemicals. Ensure to rinse your mouth after using Advair  to prevent thrush. We will contact you with the results of the [employer] test once available.  Follow-up 6-8 weeks with Dr. Geronimo or Landry NP    Montelukast  Chewable Tablets What is this medication? MONTELUKAST  (mon te LOO kast) prevents and treats the symptoms of asthma and allergies.  It works by decreasing inflammation in the airways, making it easier to breathe. Do not use this medication to treat a sudden asthma attack. This medicine may be used for other purposes; ask your health care provider or pharmacist if you have questions. COMMON BRAND NAME(S): Singulair  What should I tell my care team before I take this medication? They need to know if you have any of these conditions: Liver disease Phenylketonuria An unusual or allergic reaction to montelukast , other medications, foods, dyes, or preservatives Pregnant or trying to get pregnant Breastfeeding How should I use this medication? Take this medication by mouth with water. Take it as directed on the prescription label at the same time every day. Chew or crush it completely before swallowing. Do not swallow tablets whole. You can take this medication with or without food. If it upsets your stomach, take it with food. Keep taking it unless your care team tells you to stop. A special MedGuide will be given to you by the pharmacist with each prescription and refill. Be sure to read this information carefully each time. Talk to your care team about the use of this medication in children. While it may be prescribed for children as young as 2 years for selected conditions, precautions do apply. Overdosage: If you think you have taken too much of this medicine contact a poison control center or emergency room at once. NOTE: This medicine is only for you. Do not share this medicine with others. What if I miss a dose? If you miss a dose, skip it. Take your next dose at the normal time. Do not take extra or 2 doses at the same time to make up for the missed dose. What may interact with  this medication? Medications for seizures, such as carbamazepine, phenytoin, phenobarbital Rifabutin Rifampin This list may not describe all possible interactions. Give your health care provider a list of all the medicines, herbs, non-prescription  drugs, or dietary supplements you use. Also tell them if you smoke, drink alcohol, or use illegal drugs. Some items may interact with your medicine. What should I watch for while using this medication? Visit your care team for regular checks on your progress. Tell your care team if your allergy or asthma symptoms do not improve. Take your medication even when you do not have symptoms. If you have asthma, talk to your care team about what to do in an acute asthma attack. Always have your inhaled rescue medication for asthma attacks with you. This medication may cause thoughts of suicide or depression. This includes sudden changes in mood, behaviors, or thoughts. Also watch for sudden changes in feelings and behaviors, such as feeling anxious, agitated, panicky, irritable, hostile, aggressive, impulsive, severely restless, overly excited and hyperactive, or not being able to sleep. Call your care team right away if you experience these behaviors or worsening depression. What side effects may I notice from receiving this medication? Side effects that you should report to your care team as soon as possible: Allergic reactions--skin rash, itching, hives, swelling of the face, lips, tongue, or throat Flu-like symptoms--fever, chills, muscle pain, cough, headache, fatigue Mood and behavior changes such as anxiety, nervousness, confusion, hallucinations, irritability, hostility, thoughts of suicide or self-harm, worsening mood, feelings of depression Pain, tingling, or numbness in the hands or feet Sinus pain or pressure around the face or forehead Trouble sleeping Vivid dreams or nightmares Side effects that usually do not require medical attention (report to your care team if they continue or are bothersome): Cough Diarrhea Headache Runny or stuffy nose Sore throat Stomach pain This list may not describe all possible side effects. Call your doctor for medical advice about side effects. You may report  side effects to FDA at 1-800-FDA-1088. Where should I keep my medication? Keep out of the reach of children and pets. Store between 15 and 30 degrees C (59 and 86 degrees F). Protect from light and moisture. Keep the container tightly closed. Get rid of any unused medication after the expiration date. To get rid of medications that are no longer needed or expired: Take the medication to a medication take-back program. Check with your pharmacy or law enforcement to find a location. If you cannot return the medication, check the label or package insert to see if the medication should be thrown out in the garbage or flushed down the toilet. If you are not sure, ask your care team. If it is safe to put in the trash, empty the medication out of the container. Mix the medication with cat litter, dirt, coffee grounds, or other unwanted substance. Seal the mixture in a bag or container. Put it in the trash. NOTE: This sheet is a summary. It may not cover all possible information. If you have questions about this medicine, talk to your doctor, pharmacist, or health care provider.  2025 Elsevier/Gold Standard (2023-06-24 00:00:00)  "

## 2024-05-08 DIAGNOSIS — J4550 Severe persistent asthma, uncomplicated: Secondary | ICD-10-CM | POA: Insufficient documentation

## 2024-05-18 ENCOUNTER — Ambulatory Visit: Payer: Self-pay | Admitting: Allergy

## 2024-05-18 ENCOUNTER — Other Ambulatory Visit: Payer: Self-pay

## 2024-05-18 ENCOUNTER — Encounter: Payer: Self-pay | Admitting: Allergy

## 2024-05-18 VITALS — BP 99/70 | HR 88 | Temp 98.7°F | Resp 18 | Ht 61.5 in | Wt 141.9 lb

## 2024-05-18 DIAGNOSIS — J310 Chronic rhinitis: Secondary | ICD-10-CM

## 2024-05-18 DIAGNOSIS — J4550 Severe persistent asthma, uncomplicated: Secondary | ICD-10-CM | POA: Diagnosis not present

## 2024-05-18 MED ORDER — MONTELUKAST SODIUM 10 MG PO TABS: 10.0000 mg | ORAL_TABLET | Freq: Every day | ORAL | 5 refills | Status: DC

## 2024-05-18 MED ORDER — MONTELUKAST SODIUM 5 MG PO CHEW: 10.0000 mg | CHEWABLE_TABLET | Freq: Every day | ORAL | 5 refills | Status: AC

## 2024-05-18 MED ORDER — SPIRIVA RESPIMAT 1.25 MCG/ACT IN AERS: 2.0000 | INHALATION_SPRAY | Freq: Every day | RESPIRATORY_TRACT | 5 refills | Status: AC

## 2024-05-18 MED ORDER — BUDESONIDE-FORMOTEROL FUMARATE 160-4.5 MCG/ACT IN AERO: INHALATION_SPRAY | RESPIRATORY_TRACT | 5 refills | Status: AC

## 2024-05-18 NOTE — Progress Notes (Signed)
 "   New Patient Note  RE: Amy Petersen MRN: [id number] DOB: Jul 27, 2000 Date of Office Visit: 05/18/2024  Primary care provider: Delores Suzann HERO, MD  Chief Complaint:   History of present illness: Amy Petersen is a 24 y.o. female presenting today for evaluation of asthma. Discussed the use of AI scribe software for clinical note transcription with the patient, who gave verbal consent to proceed.  She has a history of asthma diagnosed in 202.  She recalls having COVID illness in 2020. She has experienced multiple emergency department visits due to asthma exacerbations, with notable visits in December 2025 and January 2026.  The most recent exacerbation occurred when she ran out of medication and required a approval to fill.  She has had exacerbations requiring ED visits about once or twice a year.  She does not know what triggers her asthma symptoms.  Her asthma symptoms include wheezing, chest tightness, and shortness of breath, which she experiences variably. She uses an albuterol  rescue inhaler two to three times daily this month. She has previously been on Trelegy, which she found effective in preventing episodes, but is currently using Advair  discus and Spiriva  due to insurance coverage issues. She notes that the Advair  Diskus is not as effective, describing it as sometimes delivering delivering the dose and sometimes she feels like it did not deliver the dose and it is quite thick and chalky.  She was prescribed prednisone  following her December emergency visit, which she completed, but she does not recall if she noted significant improvement with its use. She also has montelukast  (Singulair ), which she has not been consistent with due to difficulty swallowing tablets. She has only taken it for three days.  She reports some seasonal allergy symptoms, particularly in the spring, including sneezing and flu-like symptoms. She has tried Dymista  nasal spray in the past without relief, noting it  worsened her runny nose. No frequent runny nose, throat clearing, or sneezing outside of seasonal changes.       Review of systems: 10pt ROS negative unless noted above in HPI  Past medical history: Past Medical History:  Diagnosis Date   Asthma    Malaria    Recurrent UTI    Typhoid     Past surgical history: Past Surgical History:  Procedure Laterality Date   THROAT SURGERY      Family history:  History reviewed. No pertinent family history.  Social history: Lives in an apartment without carpeting with electric heating and cooling.  No pets in the home.  Cats outside the home.  No concern for water damage, mildew or roaches in the home.  She works at [employer]  Denies a smoking history.   Medication List: Current Outpatient Medications  Medication Sig Dispense Refill   albuterol  (PROVENTIL ) (2.5 MG/3ML) 0.083% nebulizer solution Take 3 mLs (2.5 mg total) by nebulization every 6 (six) hours as needed for wheezing or shortness of breath. 75 mL 12   albuterol  (VENTOLIN  HFA) 108 (90 Base) MCG/ACT inhaler Inhale 2 puffs into the lungs every 6 (six) hours as needed for wheezing or shortness of breath. 18 g 6   budesonide -formoterol  (SYMBICORT ) 160-4.5 MCG/ACT inhaler two puffs twice daily with spacer.  Rinse mouth after use. 1 each 5   etonogestrel  (NEXPLANON ) 68 MG IMPL implant 1 each by Subdermal route once.     montelukast  (SINGULAIR ) 5 MG chewable tablet Chew 2 tablets (10 mg total) by mouth at bedtime. 60 tablet 5   Azelastine -Fluticasone  (DYMISTA ) 137-50 MCG/ACT SUSP  Place 2 sprays into the nose 2 (two) times daily as needed. (Patient not taking: Reported on 05/18/2024) 1 g 5   predniSONE  (DELTASONE ) 50 MG tablet One tablet a day for 4 days.  Begin on 12/29. (Patient not taking: Reported on 05/18/2024) 4 tablet 0   Tiotropium Bromide (SPIRIVA  RESPIMAT) 1.25 MCG/ACT AERS Inhale 2 puffs into the lungs daily. (Patient not taking: Reported on 05/18/2024) 1 g 6   No current  facility-administered medications for this visit.    Known medication allergies: Allergies[1]   Physical examination: Blood pressure 99/70, pulse 88, temperature 98.7 F (37.1 C), resp. rate 18, height 5' 1.5 (1.562 m), weight 141 lb 14.4 oz (64.4 kg), SpO2 98%.  General: Alert, interactive, in no acute distress. HEENT: PERRLA, TMs pearly gray, turbinates minimally edematous without discharge, post-pharynx non erythematous. Neck: Supple without lymphadenopathy. Lungs: Clear to auscultation without wheezing, rhonchi or rales. {no increased work of breathing. CV: Normal S1, S2 without murmurs. Abdomen: Nondistended, nontender. Skin: Warm and dry, without lesions or rashes. Extremities:  No clubbing, cyanosis or edema. Neuro:   Grossly intact.  Diagnostics/Labs:  Spirometry: FEV1: 2.1L 76%, FVC: 2.98L 89%, ratio consistent with recurrent   Assessment and plan: Severe persistent asthma Rhinitis   - Spacer sample and demonstration provided. - Daily controller medication(s):  Symbicort  160/4.72mcg two puffs twice daily with spacer.  Rinse mouth after use.   Changing Advair  discus to Symbicort  due to ease of device use Spiriva  2 puffs daily Montelukast  5mg  chewable tab (2 tabs = 10mg ) at bedtime.   Singulair  (montelukast ) 10mg  daily.  This is an antileukotriene that can help with both allergy and asthma symptom control.  If you notice any change in mood/behavior/sleep after starting Singulair  then stop this medication and let us  know.  Symptoms resolve after stopping the medication.  - Prior to physical activity: albuterol  2 puffs 10-15 minutes before physical activity. - Rescue medications: albuterol  2 puffs every 4 hours as needed Can use Symbicort  2 puffs every 4 hours as a rescue medication if needed - Asthma control goals:  * Full participation in all desired activities (may need albuterol  before activity) * Albuterol  use two time or less a week on average (not counting use  with activity) * Cough interfering with sleep two time or less a month * Oral steroids no more than once a year * No hospitalizations   - singulair  is also a allergy medication.  Will see how you respond to singulair  first over next month before recommending any additional allergy medications.   Schedule skin testing visit and hold antihistamines (ie. Benadryl , claritin, zyrtec, allegra, xyzal , cough/cold medications OTC) (Env 1-55)  I appreciate the opportunity to take part in Amy Petersen's care. Please do not hesitate to contact me with questions.  Sincerely,   Danita Brain, MD Allergy/Immunology [facility]    [1] No Known Allergies  "

## 2024-05-18 NOTE — Patient Instructions (Addendum)
-   Spacer sample and demonstration provided. - Daily controller medication(s):  Symbicort  160/4.59mcg two puffs twice daily with spacer.  Rinse mouth after use. Montelukast  5mg  chewable tab (2 tabs = 10mg ) at bedtime.   Spiriva  2 puffs daily Singulair  (montelukast ) 10mg  daily.  This is an antileukotriene that can help with both allergy and asthma symptom control.  If you notice any change in mood/behavior/sleep after starting Singulair  then stop this medication and let us  know.  Symptoms resolve after stopping the medication.  - Prior to physical activity: albuterol  2 puffs 10-15 minutes before physical activity. - Rescue medications: albuterol  2 puffs every 4 hours as needed Can use Symbicort  2 puffs every 4 hours as a rescue medication if needed - Asthma control goals:  * Full participation in all desired activities (may need albuterol  before activity) * Albuterol  use two time or less a week on average (not counting use with activity) * Cough interfering with sleep two time or less a month * Oral steroids no more than once a year * No hospitalizations   - singulair  is also a allergy medication.  Will see how you respond to singulair  first over next month before recommending any additional allergy medications.   Schedule skin testing visit and hold antihistamines (ie. Benadryl , claritin, zyrtec, allegra, xyzal , cough/cold medications OTC)

## 2024-05-26 ENCOUNTER — Ambulatory Visit: Admitting: Allergy

## 2024-05-26 DIAGNOSIS — J310 Chronic rhinitis: Secondary | ICD-10-CM

## 2024-05-26 NOTE — Patient Instructions (Addendum)
 - Daily controller medication(s):  Trelegy 1 puff once a day (use this triple therapy inhaler since it was covered by insurance and will simplify regimen) Montelukast  5mg  chewable tab (2 tabs = 10mg ) at bedtime.  This is an antileukotriene that can help with both allergy and asthma symptom control.  If you notice any change in mood/behavior/sleep after starting Singulair  then stop this medication and let us  know.  Symptoms resolve after stopping the medication.  - Prior to physical activity: albuterol  2 puffs 10-15 minutes before physical activity. - Rescue medications: albuterol  2 puffs every 4 hours as needed Can use Symbicort  2 puffs every 4 hours as a rescue medication if needed - Asthma control goals:  * Full participation in all desired activities (may need albuterol  before activity) * Albuterol  use two time or less a week on average (not counting use with activity) * Cough interfering with sleep two time or less a month * Oral steroids no more than once a year * No hospitalizations   - singulair  is also a allergy medication.  Will see how you respond to singulair  first over next month before recommending any additional allergy medications.   - Testing today showed: grasses, ragweed, weeds, trees, indoor molds, outdoor molds, dust mites, dog, and cockroach - Copy of test results provided.  - Avoidance measures provided. - Consider allergy shots as a means of long-term control. - Allergy shots re-train and reset the immune system to ignore environmental allergens and decrease the resulting immune response to those allergens (sneezing, itchy watery eyes, runny nose, nasal congestion, etc).    - Allergy shots improve symptoms in 75-85% of patients.  - We can discuss more at the next appointment if the medications are not working for you.  Follow-up in 3 months --------------------------------------------  Avoidance measures: Reducing Pollen Exposure  The American Academy of  Allergy, Asthma and Immunology suggests the following steps to reduce your exposure to pollen during allergy seasons.    Do not hang sheets or clothing out to dry; pollen may collect on these items. Do not mow lawns or spend time around freshly cut grass; mowing stirs up pollen. Keep windows closed at night.  Keep car windows closed while driving. Minimize morning activities outdoors, a time when pollen counts are usually at their highest. Stay indoors as much as possible when pollen counts or humidity is high and on windy days when pollen tends to remain in the air longer. Use air conditioning when possible.  Many air conditioners have filters that trap the pollen spores. Use a HEPA room air filter to remove pollen form the indoor air you breathe.  Control of Mold Allergen   Mold and fungi can grow on a variety of surfaces provided certain temperature and moisture conditions exist.  Outdoor molds grow on plants, decaying vegetation and soil.  The major outdoor mold, Alternaria and Cladosporium, are found in very high numbers during hot and dry conditions.  Generally, a late Summer - Fall peak is seen for common outdoor fungal spores.  Rain will temporarily lower outdoor mold spore count, but counts rise rapidly when the rainy period ends.  The most important indoor molds are Aspergillus and Penicillium.  Dark, humid and poorly ventilated basements are ideal sites for mold growth.  The next most common sites of mold growth are the bathroom and the kitchen.  Outdoor (Seasonal) Mold Control Use air conditioning and keep windows closed Avoid exposure to decaying vegetation. Avoid leaf raking. Avoid grain handling. Consider wearing  a face mask if working in moldy areas.   Indoor (Perennial) Mold Control  Maintain humidity below 50%. Clean washable surfaces with 5% bleach solution. Remove sources e.g. contaminated carpets.  Control of Dust Mite Allergen    Dust mites play a major role in  allergic asthma and rhinitis.  They occur in environments with high humidity wherever human skin is found.  Dust mites absorb humidity from the atmosphere (ie, they do not drink) and feed on organic matter (including shed human and animal skin).  Dust mites are a microscopic type of insect that you cannot see with the naked eye.  High levels of dust mites have been detected from mattresses, pillows, carpets, upholstered furniture, bed covers, clothes, soft toys and any woven material.  The principal allergen of the dust mite is found in its feces.  A gram of dust may contain 1,000 mites and 250,000 fecal particles.  Mite antigen is easily measured in the air during house cleaning activities.  Dust mites do not bite and do not cause harm to humans, other than by triggering allergies/asthma.    Ways to decrease your exposure to dust mites in your home:  Encase mattresses, box springs and pillows with a mite-impermeable barrier or cover   Wash sheets, blankets and drapes weekly in hot water (130 F) with detergent and dry them in a dryer on the hot setting.  Have the room cleaned frequently with a vacuum cleaner and a damp dust-mop.  For carpeting or rugs, vacuuming with a vacuum cleaner equipped with a high-efficiency particulate air (HEPA) filter.  The dust mite allergic individual should not be in a room which is being cleaned and should wait 1 hour after cleaning before going into the room. Do not sleep on upholstered furniture (eg, couches).   If possible removing carpeting, upholstered furniture and drapery from the home is ideal.  Horizontal blinds should be eliminated in the rooms where the person spends the most time (bedroom, study, television room).  Washable vinyl, roller-type shades are optimal. Remove all non-washable stuffed toys from the bedroom.  Wash stuffed toys weekly like sheets and blankets above.   Reduce indoor humidity to less than 50%.  Inexpensive humidity monitors can be purchased  at most hardware stores.  Do not use a humidifier as can make the problem worse and are not recommended.   Control of Dog or Cat Allergen  Avoidance is the best way to manage a dog or cat allergy. If you have a dog or cat and are allergic to dog or cats, consider removing the dog or cat from the home. If you have a dog or cat but dont want to find it a new home, or if your family wants a pet even though someone in the household is allergic, here are some strategies that may help keep symptoms at bay:  Keep the pet out of your bedroom and restrict it to only a few rooms. Be advised that keeping the dog or cat in only one room will not limit the allergens to that room. Dont pet, hug or kiss the dog or cat; if you do, wash your hands with soap and water. High-efficiency particulate air (HEPA) cleaners run continuously in a bedroom or living room can reduce allergen levels over time. Regular use of a high-efficiency vacuum cleaner or a central vacuum can reduce allergen levels. Giving your dog or cat a bath at least once a week can reduce airborne allergen.   Control of Cockroach  Allergen  Cockroach allergen has been identified as an important cause of acute attacks of asthma, especially in urban settings.  There are fifty-five species of cockroach that exist in the [place] , however only three, the American, German and Oriental species produce allergen that can affect patients with Asthma.  Allergens can be obtained from fecal particles, egg casings and secretions from cockroaches.    Remove food sources. Reduce access to water. Seal access and entry points. Spray runways with 0.5-1% Diazinon or Chlorpyrifos Blow boric acid power under stoves and refrigerator. Place bait stations (hydramethylnon) at feeding sites.

## 2024-05-27 ENCOUNTER — Encounter: Payer: Self-pay | Admitting: Allergy

## 2024-05-27 NOTE — Progress Notes (Signed)
 "   Follow-up Note  RE: Amy Petersen MRN: [id number] DOB: January 04, 2001 Date of Office Visit: 05/26/2024   History of present illness: Amy Petersen is a 24 y.o. female presenting today for skin testing visit.  She was last seen in the office on 05/18/24 for asthma, rhinitis.  She is in her usual state of health today without recent illness.  She has held antihistamines for at least 3 days for testing today.   Medication List: Current Outpatient Medications  Medication Sig Dispense Refill   albuterol  (PROVENTIL ) (2.5 MG/3ML) 0.083% nebulizer solution Take 3 mLs (2.5 mg total) by nebulization every 6 (six) hours as needed for wheezing or shortness of breath. 75 mL 12   albuterol  (VENTOLIN  HFA) 108 (90 Base) MCG/ACT inhaler Inhale 2 puffs into the lungs every 6 (six) hours as needed for wheezing or shortness of breath. 18 g 6   Azelastine -Fluticasone  (DYMISTA ) 137-50 MCG/ACT SUSP Place 2 sprays into the nose 2 (two) times daily as needed. (Patient not taking: Reported on 05/18/2024) 1 g 5   budesonide -formoterol  (SYMBICORT ) 160-4.5 MCG/ACT inhaler two puffs twice daily with spacer.  Rinse mouth after use. 1 each 5   etonogestrel  (NEXPLANON ) 68 MG IMPL implant 1 each by Subdermal route once.     montelukast  (SINGULAIR ) 5 MG chewable tablet Chew 2 tablets (10 mg total) by mouth at bedtime. 60 tablet 5   predniSONE  (DELTASONE ) 50 MG tablet One tablet a day for 4 days.  Begin on 12/29. (Patient not taking: Reported on 05/18/2024) 4 tablet 0   Tiotropium Bromide (SPIRIVA  RESPIMAT) 1.25 MCG/ACT AERS Inhale 2 puffs into the lungs daily. 1 g 5   No current facility-administered medications for this visit.     Known medication allergies: Allergies[1]   Diagnoitics/Labs:  Allergy testing:   Airborne Adult Perc - 05/26/24 1438     Time Antigen Placed 1438    Allergen Manufacturer Jestine    Location Back    Number of Test 55    1. Control-Buffer 50% Glycerol Negative    2. Control-Histamine 2+     3. Bahia 2+    4. Bermuda 2+    5. Johnson Negative    6. [place]  Blue Negative    7. Meadow Fescue Negative    8. Perennial Rye Negative    9. Timothy 2+    10. Ragweed Mix 2+    11. Cocklebur Negative    12. Plantain,  English 2+    13. Baccharis Negative    14. Dog Fennel Negative    15. Russian Thistle 2+    16. Lamb's Quarters Negative    17. Sheep Sorrell Negative    18. Rough Pigweed 2+    19. Marsh Elder, Rough 2+    20. Mugwort, Common 2+    21. Box, Elder Negative    22. Cedar, red Negative    23. Sweet Gum Negative    24. Pecan Pollen Negative    25. Pine Mix Negative    26. Walnut, Black Pollen Negative    27. Red Mulberry Negative    28. Ash Mix 2+    29. Birch Mix Negative    30. Beech American 2+    31. Cottonwood, Eastern 2+    32. Hickory, White Negative    33. Maple Mix Negative    34. Oak, Eastern Mix 2+    35. Sycamore Eastern Negative    36. Alternaria Alternata Negative    37. Cladosporium Herbarum Negative  38. Aspergillus Mix Negative    39. Penicillium Mix 2+    40. Bipolaris Sorokiniana (Helminthosporium) 2+    41. Drechslera Spicifera (Curvularia) Negative    42. Mucor Plumbeus Negative    43. Fusarium Moniliforme Negative    44. Aureobasidium Pullulans (pullulara) Negative    45. Rhizopus Oryzae Negative    46. Botrytis Cinera Negative    47. Epicoccum Nigrum Negative    48. Phoma Betae Negative    49. Dust Mite Mix 3+    50. Cat Hair 10,000 BAU/ml Negative    51.  Dog Epithelia 2+    52. Mixed Feathers Negative    53. Horse Epithelia Negative    54. Cockroach, German 3+    55. Tobacco Leaf Negative          Allergy testing results were read and interpreted by provider, documented by clinical staff.   Assessment and plan: Severe persistent asthma - Daily controller medication(s):  Trelegy 1 puff once a day (use this triple therapy inhaler since it was covered by insurance and will simplify regimen) Montelukast  5mg   chewable tab (2 tabs = 10mg ) at bedtime.  This is an antileukotriene that can help with both allergy and asthma symptom control.  If you notice any change in mood/behavior/sleep after starting Singulair  then stop this medication and let us  know.  Symptoms resolve after stopping the medication.  - Prior to physical activity: albuterol  2 puffs 10-15 minutes before physical activity. - Rescue medications: albuterol  2 puffs every 4 hours as needed Can use Symbicort  2 puffs every 4 hours as a rescue medication if needed - Asthma control goals:  * Full participation in all desired activities (may need albuterol  before activity) * Albuterol  use two time or less a week on average (not counting use with activity) * Cough interfering with sleep two time or less a month * Oral steroids no more than once a year * No hospitalizations   Rhinitis - singulair  is also a allergy medication.  Will see how you respond to singulair  first over next month before recommending any additional allergy medications.   - Testing today showed: grasses, ragweed, weeds, trees, indoor molds, outdoor molds, dust mites, dog, and cockroach - Copy of test results provided.  - Avoidance measures provided. - Consider allergy shots as a means of long-term control. - Allergy shots re-train and reset the immune system to ignore environmental allergens and decrease the resulting immune response to those allergens (sneezing, itchy watery eyes, runny nose, nasal congestion, etc).    - Allergy shots improve symptoms in 75-85% of patients.  - We can discuss more at the next appointment if the medications are not working for you.  Follow-up in 3 months I appreciate the opportunity to take part in Collie's care. Please do not hesitate to contact me with questions.  Sincerely,   Danita Brain, MD Allergy/Immunology [facility]      [1] No Known Allergies  "

## 2024-07-01 ENCOUNTER — Ambulatory Visit: Admitting: Internal Medicine

## 2024-08-31 ENCOUNTER — Ambulatory Visit: Admitting: Allergy
